# Patient Record
Sex: Male | Born: 1980
Health system: Southern US, Community
[De-identification: ages and names within clinical notes are randomized; demographics above are authoritative.]

## PROBLEM LIST (undated history)

## (undated) DIAGNOSIS — M109 Gout, unspecified: Secondary | ICD-10-CM

## (undated) DIAGNOSIS — J302 Other seasonal allergic rhinitis: Secondary | ICD-10-CM

## (undated) DIAGNOSIS — J45909 Unspecified asthma, uncomplicated: Secondary | ICD-10-CM

## (undated) HISTORY — PX: MOUTH SURGERY: SHX715

---

## 2015-01-30 ENCOUNTER — Encounter: Payer: Self-pay | Admitting: Emergency Medicine

## 2015-01-30 ENCOUNTER — Ambulatory Visit: Payer: BLUE CROSS/BLUE SHIELD

## 2015-01-30 ENCOUNTER — Ambulatory Visit
Admission: EM | Admit: 2015-01-30 | Discharge: 2015-01-30 | Disposition: A | Discharge status: Home / Self Care | Payer: BLUE CROSS/BLUE SHIELD | Attending: Family Medicine | Admitting: Family Medicine

## 2015-01-30 DIAGNOSIS — M79671 Pain in right foot: Secondary | ICD-10-CM | POA: Diagnosis present

## 2015-01-30 DIAGNOSIS — M10071 Idiopathic gout, right ankle and foot: Secondary | ICD-10-CM

## 2015-01-30 DIAGNOSIS — J45909 Unspecified asthma, uncomplicated: Secondary | ICD-10-CM | POA: Diagnosis not present

## 2015-01-30 DIAGNOSIS — M109 Gout, unspecified: Secondary | ICD-10-CM | POA: Diagnosis not present

## 2015-01-30 DIAGNOSIS — Z79899 Other long term (current) drug therapy: Secondary | ICD-10-CM | POA: Insufficient documentation

## 2015-01-30 HISTORY — DX: Unspecified asthma, uncomplicated: J45.909

## 2015-01-30 HISTORY — DX: Other seasonal allergic rhinitis: J30.2

## 2015-01-30 LAB — CBC WITH DIFFERENTIAL/PLATELET
Basophils Absolute: 0.1 10*3/uL (ref 0–0.1)
Basophils Relative: 1 %
EOS ABS: 0.3 10*3/uL (ref 0–0.7)
EOS PCT: 5 %
HEMATOCRIT: 44.3 % (ref 40.0–52.0)
Hemoglobin: 15 g/dL (ref 13.0–18.0)
LYMPHS ABS: 2 10*3/uL (ref 1.0–3.6)
LYMPHS PCT: 31 %
MCH: 30 pg (ref 26.0–34.0)
MCHC: 33.9 g/dL (ref 32.0–36.0)
MCV: 88.6 fL (ref 80.0–100.0)
MONO ABS: 0.4 10*3/uL (ref 0.2–1.0)
Monocytes Relative: 7 %
Neutro Abs: 3.6 10*3/uL (ref 1.4–6.5)
Neutrophils Relative %: 56 %
PLATELETS: 225 10*3/uL (ref 150–440)
RBC: 5 MIL/uL (ref 4.40–5.90)
RDW: 13.4 % (ref 11.5–14.5)
WBC: 6.5 10*3/uL (ref 3.8–10.6)

## 2015-01-30 LAB — BASIC METABOLIC PANEL
ANION GAP: 10 (ref 5–15)
BUN: 12 mg/dL (ref 6–20)
CHLORIDE: 102 mmol/L (ref 101–111)
CO2: 28 mmol/L (ref 22–32)
Calcium: 9.8 mg/dL (ref 8.9–10.3)
Creatinine, Ser: 1.24 mg/dL (ref 0.61–1.24)
GFR calc Af Amer: >60 mL/min (ref >60)
GFR calc non Af Amer: >60 mL/min (ref >60)
Glucose, Bld: 125 mg/dL — ABNORMAL HIGH (ref 65–99)
POTASSIUM: 4.4 mmol/L (ref 3.5–5.1)
Sodium: 140 mmol/L (ref 135–145)

## 2015-01-30 LAB — URIC ACID: URIC ACID, SERUM: 6.8 mg/dL (ref 4.4–7.6)

## 2015-01-30 MED ORDER — COLCHICINE 0.6 MG PO TABS: 1.2000 mg | ORAL_TABLET | Freq: Once | ORAL | Status: DC

## 2015-01-30 NOTE — Discharge Instructions (Signed)

## 2015-01-30 NOTE — ED Provider Notes (Signed)
CSN: [id number]     Arrival date & time 01/30/15  0707 History   First MD Initiated Contact with Patient 01/30/15 0747     Chief Complaint  Patient presents with  . Foot Pain    right   (Consider location/radiation/quality/duration/timing/severity/associated sxs/prior Treatment) HPI Comments: Caucasian male here for right foot swelling and great toe joint pain/swelling thinks it is gout as father developed gout around this age also.  First noticed pain 12 Jun after riding bike and playing with kids in the yard.  Progressively worsening but improved with ice pack/rest.  Has been woken up in the middle of the night when position change causes pain.  Started out just as soreness then swelling slight ly warm to touch; headache yesterday like his regular TMJ headache thinks it was triggered by foot/toe pain waking him up/not sleeping well the night before  Patient is a 34 y.o. male presenting with lower extremity pain. The history is provided by the patient.  Foot Pain This is a new problem. The current episode started more than 2 days ago. The problem occurs constantly. The problem has been gradually worsening. Associated symptoms include headaches. Pertinent negatives include no chest pain, no abdominal pain and no shortness of breath. The symptoms are aggravated by standing, exertion and walking. The symptoms are relieved by ice, relaxation and position. He has tried a cold compress, rest, food and water for the symptoms. The treatment provided mild relief.    Past Medical History  Diagnosis Date  . Asthma   . Seasonal allergies    History reviewed. No pertinent past surgical history. History reviewed. No pertinent family history. History  Substance Use Topics  . Smoking status: Never Smoker   . Smokeless tobacco: Never Used  . Alcohol Use: No    Review of Systems  Constitutional: Negative for fever, chills, diaphoresis, activity change, appetite change and fatigue.  HENT: Negative for  congestion, facial swelling and mouth sores.   Eyes: Negative for photophobia, pain, discharge, redness, itching and visual disturbance.  Respiratory: Negative for cough, choking, shortness of breath, wheezing and stridor.   Cardiovascular: Negative for chest pain, palpitations and leg swelling.  Gastrointestinal: Negative for nausea, vomiting, abdominal pain and diarrhea.  Endocrine: Negative for cold intolerance and heat intolerance.  Genitourinary: Negative for dysuria, flank pain and difficulty urinating.  Musculoskeletal: Positive for joint swelling and gait problem. Negative for myalgias, back pain, arthralgias, neck pain and neck stiffness.  Skin: Positive for color change. Negative for pallor, rash and wound.  Allergic/Immunologic: Positive for environmental allergies. Negative for food allergies.  Neurological: Positive for headaches. Negative for dizziness, tremors, seizures, syncope, facial asymmetry, speech difficulty, weakness, light-headedness and numbness.  Hematological: Negative for adenopathy. Does not bruise/bleed easily.  Psychiatric/Behavioral: Positive for sleep disturbance. Negative for behavioral problems, confusion and agitation.    Allergies  Nsaids  Home Medications   Prior to Admission medications   Medication Sig Start Date End Date Taking? Authorizing Provider  azelastine (OPTIVAR) 0.05 % ophthalmic solution Place 1 drop into both eyes 2 (two) times daily.   Yes Historical Provider, MD  fexofenadine (ALLEGRA) 180 MG tablet Take 180 mg by mouth daily.   Yes Historical Provider, MD  mometasone (NASONEX) 50 MCG/ACT nasal spray Place 2 sprays into the nose daily.   Yes Historical Provider, MD  montelukast (SINGULAIR) 10 MG tablet Take 10 mg by mouth at bedtime.   Yes Historical Provider, MD  colchicine 0.6 MG tablet Take 2 tablets (1.2 mg total)  by mouth once. May take 0.6mg  tab 1 hour after loading dose; may repeat dosing in 3 days if needed 01/30/15   Jarold Song  Betancourt, NP   BP 144/90 mmHg  Pulse 75  Temp(Src) 98 F (36.7 C) (Oral)  Resp 16  Ht  (1.778 m)  Wt 190 lb (86.183 kg)  BMI 27.26 kg/m2  SpO2 99% Physical Exam  Constitutional: He is oriented to person, place, and time. Vital signs are normal. He appears well-developed and well-nourished. No distress.  HENT:  Head: Normocephalic and atraumatic.  Right Ear: External ear normal.  Left Ear: External ear normal.  Nose: Nose normal.  Mouth/Throat: Oropharynx is clear and moist. No oropharyngeal exudate.  Eyes: Conjunctivae, EOM and lids are normal. Pupils are equal, round, and reactive to light. Right eye exhibits no discharge. Left eye exhibits no discharge. No scleral icterus.  Neck: Trachea normal and normal range of motion. Neck supple. No tracheal deviation present. No thyromegaly present.  Cardiovascular: Normal rate, regular rhythm and intact distal pulses.   Pulmonary/Chest: Effort normal. No stridor. No respiratory distress. He has no wheezes.  Abdominal: Soft. He exhibits no distension.  Musculoskeletal: Normal range of motion. He exhibits edema and tenderness.       Right ankle: Normal. He exhibits normal range of motion, no swelling, no ecchymosis, no deformity, no laceration and normal pulse. No tenderness. No lateral malleolus and no medial malleolus tenderness found. Achilles tendon normal. Achilles tendon exhibits no pain and no defect.       Right foot: There is tenderness and swelling. There is normal range of motion, no bony tenderness, normal capillary refill, no crepitus, no deformity and no laceration.       Feet:  Dorsum and plantar 1st MTP warm to touch and nonpitting edema 1+/4  Neurological: He is alert and oriented to person, place, and time. Coordination normal.  Skin: Skin is warm, dry and intact. Rash noted. No abrasion, no bruising, no burn, no ecchymosis, no laceration, no lesion, no petechiae and no purpura noted. Rash is macular. Rash is not  papular, not maculopapular, not nodular, not pustular, not vesicular and not urticarial. He is not diaphoretic. There is erythema. No cyanosis. No pallor. Nails show no clubbing.  Psychiatric: He has a normal mood and affect. His speech is normal and behavior is normal. Judgment and thought content normal. Cognition and memory are normal.  Nursing note and vitals reviewed.   ED Course  Procedures (including critical care time) Labs Review Labs Reviewed  BASIC METABOLIC PANEL - Abnormal; Notable for the following:    Glucose, Bld 125 (*)    All other components within normal limits  CBC WITH DIFFERENTIAL/PLATELET  URIC ACID    Imaging Review Dg Foot Complete Right  01/30/2015   CLINICAL DATA:  Right foot pain and swelling for 1 week, no known injury  EXAM: RIGHT FOOT COMPLETE - 3+ VIEW  COMPARISON:  None.  FINDINGS: Joint spaces appear relatively normal. There is some soft tissue prominence adjacent to the right first MTP joint. No erosion is seen, but early changes of gout cannot be excluded. No soft tissue calcification is noted. Tarsal metatarsal alignment is normal. There is a small plantar calcaneal degenerative spur present.  IMPRESSION: 1. No acute bony abnormality. Soft tissue swelling adjacent of the right first MTP joint. Cannot exclude early gout. 2. Small plantar calcaneal degenerative spur.   Electronically Signed   By: Dwyane Dee M.D.   On: 01/30/2015 08:20  Patient given copy of radiology and lab reports discussed them with patient.  Given [facility] handout on foods to avoid and foods to consider as studies show lowering of uric acid levels to patient along with exitcare handout on gout.  Work note for today along with 7 days of restrictions avoid heavy lifting/strenuous activity.  Reiterated avoid dehydration.  Patient verbalized understanding of information/instructions,agreed with plan of care and had no further questions at this time.  MDM   1. Acute gout of right foot,  unspecified cause    Will get x-ray and uric acid levels. Discussed pathophysiology of gout with patient.   Exitcare handout on gout given to patient and discussed foods to avoid:  beer, hard liquor, meats, seafood, high corn fructose beverages/food items.  Will get baseline uric acid level, cbc to check for infection and image joint to check for arthritis also.  Discussed could be pseudogout, gout, infection with patient.  Trial of colchicine as NSAID allergy.  Diet modification.  Discussed aspiration of joint fluid may be indicated if worsening symptoms along with antibiotics (pending labs at this time)   [facility] up with [facility] for annual preventive visit in 6 months; sooner if problems or worsening of symptoms despite following above plan of care.  Patient verbalized understanding of instructions, agreed with plan of care and had no further questions at this time. P2:  low purine diet    Barbaraann Barthel, NP 01/30/15 2774  Barbaraann Barthel, NP 01/30/15 [phone]

## 2015-01-30 NOTE — ED Notes (Signed)
Patient c/o right foot pain and swelling and in his right 1st toe for 3 days.  Patient denies injury.

## 2015-09-24 ENCOUNTER — Ambulatory Visit
Admission: EM | Admit: 2015-09-24 | Discharge: 2015-09-24 | Disposition: A | Discharge status: Home / Self Care | Payer: BLUE CROSS/BLUE SHIELD | Attending: Family Medicine | Admitting: Family Medicine

## 2015-09-24 ENCOUNTER — Encounter: Payer: Self-pay | Admitting: *Deleted

## 2015-09-24 DIAGNOSIS — R109 Unspecified abdominal pain: Secondary | ICD-10-CM | POA: Diagnosis not present

## 2015-09-24 HISTORY — DX: Gout, unspecified: M10.9

## 2015-09-24 LAB — URINALYSIS COMPLETE WITH MICROSCOPIC (ARMC ONLY)
BACTERIA UA: NONE SEEN
Glucose, UA: NEGATIVE mg/dL
Leukocytes, UA: NEGATIVE
NITRITE: NEGATIVE
Protein, ur: NEGATIVE mg/dL
SQUAMOUS EPITHELIAL / LPF: NONE SEEN
Specific Gravity, Urine: 1.025 (ref 1.005–1.030)
pH: 6 (ref 5.0–8.0)

## 2015-09-24 MED ORDER — TAMSULOSIN HCL 0.4 MG PO CAPS: 0.4000 mg | ORAL_CAPSULE | Freq: Every day | ORAL | Status: DC

## 2015-09-24 NOTE — ED Notes (Signed)
Patient started having severe vomiting (with no diarrhea) this past Sunday evening which has resolved, but he continues to have severe left flank pain. Patient has taken one norco with no resolution.

## 2015-09-24 NOTE — Discharge Instructions (Signed)
Flank Pain °Flank pain refers to pain that is located on the side of the body between the upper abdomen and the back. The pain may occur over a short period of time (acute) or may be long-term or reoccurring (chronic). It may be mild or severe. Flank pain can be caused by many things. °CAUSES  °Some of the more common causes of flank pain include: °· Muscle strains.   °· Muscle spasms.   °· A disease of your spine (vertebral disk disease).   °· A lung infection (pneumonia).   °· Fluid around your lungs (pulmonary edema).   °· A kidney infection.   °· Kidney stones.   °· A very painful skin rash caused by the chickenpox virus (shingles).   °· Gallbladder disease.   °HOME CARE INSTRUCTIONS  °Home care will depend on the cause of your pain. In general, °· Rest as directed by your caregiver. °· Drink enough fluids to keep your urine clear or pale yellow. °· Only take over-the-counter or prescription medicines as directed by your caregiver. Some medicines may help relieve the pain. °· Tell your caregiver about any changes in your pain. °· Follow up with your caregiver as directed. °SEEK IMMEDIATE MEDICAL CARE IF:  °· Your pain is not controlled with medicine.   °· You have new or worsening symptoms. °· Your pain increases.   °· You have abdominal pain.   °· You have shortness of breath.   °· You have persistent nausea or vomiting.   °· You have swelling in your abdomen.   °· You feel faint or pass out.   °· You have blood in your urine. °· You have a fever or persistent symptoms for more than 2-3 days. °· You have a fever and your symptoms suddenly get worse. °MAKE SURE YOU:  °· Understand these instructions. °· Will watch your condition. °· Will get help right away if you are not doing well or get worse. °  °This information is not intended to replace advice given to you by your health care provider. Make sure you discuss any questions you have with your health care provider. °  °Document Released: 09/24/2005 Document  Revised: 04/27/2012 Document Reviewed: 03/17/2012 °Elsevier Interactive Patient Education ©2016 [employer] ° °

## 2015-09-24 NOTE — ED Provider Notes (Signed)
CSN: [id number]     Arrival date & time 09/24/15  1849 History   First MD Initiated Contact with Patient 09/24/15 2131     Chief Complaint  Patient presents with  . Flank Pain   (Consider location/radiation/quality/duration/timing/severity/associated sxs/prior Treatment) HPI  35 yo M reports sudden onset left flank pain while seated watching SuperBowl 2 days ago No physical activity, no trauma- intense sudden onset reported,apparently spontaneous- During the night developed abdominal discomfort, nausea and vomiting, no diarrhea. Recalled that a number of men at work had  Viral gastritis during the week before and suspected the same. Wife is pharmacist and had Zofran 8 mg available-which helped. Never developed diarrhea. Pain localized to mid left back /flank. He is reported to have true allergy to NSAIDs (respiratory distress) and utilized  Medication available- One Norco for pain yesterday, today again at 6:30 pm. Pain continues- now has some left lower quadrant radiation, with left scrotal ache. Has been drinking G2 and water- voiding regularly - has not knowingly passed anything No previous history of kidney stones, kidney colic or GI disease No fever noted Has hx Right great toe gout and has standing  colchicine order -reports about 3 episodes in interim year, none recently Hx asthma Past Medical History  Diagnosis Date  . Asthma   . Seasonal allergies    History reviewed. No pertinent past surgical history. History reviewed. No pertinent family history. Social History  Substance Use Topics  . Smoking status: Never Smoker   . Smokeless tobacco: Never Used  . Alcohol Use: No    Review of Systems Constitutional: No fever. No headache. Eyes: No visual changes. ENT:No sore throat. Cardiovascular:Negative for chest pain/palpitations Respiratory: Negative for shortness of breath Gastrointestinal: No abdominal pain after first day, Mild nausea, No vomiting, No  diarrhea Genitourinary: Negative for dysuria. Normal urination. Left flank pain persists, mildly peristaltic Musculoskeletal: Negative for low back pain- directed to mid left flank. FROM extremities without pain Skin: Negative for rash Neurological: Negative for headache, focal weakness or numbness  Allergies  Gluten meal and Nsaids  Home Medications   Prior to Admission medications   Medication Sig Start Date End Date Taking? Authorizing Provider  azelastine (OPTIVAR) 0.05 % ophthalmic solution Place 1 drop into both eyes 2 (two) times daily.   Yes Historical Provider, MD  colchicine 0.6 MG tablet Take 2 tablets (1.2 mg total) by mouth once. May take 0.6mg  tab 1 hour after loading dose; may repeat dosing in 3 days if needed 01/30/15  Yes Barbaraann Barthel, NP  fexofenadine (ALLEGRA) 180 MG tablet Take 180 mg by mouth daily.   Yes Historical Provider, MD  mometasone (NASONEX) 50 MCG/ACT nasal spray Place 2 sprays into the nose daily.   Yes Historical Provider, MD  montelukast (SINGULAIR) 10 MG tablet Take 10 mg by mouth at bedtime.   Yes Historical Provider, MD  tamsulosin (FLOMAX) 0.4 MG CAPS capsule Take 1 capsule (0.4 mg total) by mouth daily. 09/24/15   Rae Halsted, PA-C   Meds Ordered and Administered this Visit  Medications - No data to display  BP 146/98 mmHg  Pulse 85  Temp(Src) 99.5 F (37.5 C) (Tympanic)  Resp 16  Ht  (1.778 m)  Wt 192 lb (87.091 kg)  BMI 27.55 kg/m2  SpO2 97% No data found.   Physical Exam   Constitutional -alert and oriented,uncomfortable , holding left flank General: Mild/Mod  distress Head-atraumatic, normocephalic Eyes- conjunctiva normal, EOMI ,conjugate gaze Ears: grossly normal hearing  Nose- no congestion or rhinorrhea Mouth/throat- mucous membranes moist ,oropharynx non-erythematous Neck- supple without glandular enlargement CV- regular rate and rhythmn Resp-no distress, normal respiratory effort,clear to auscultation  bilaterally Back- localized discomfort left mid flank , no skin changes, no heat, no mass- even light percussion yields pain GI- soft,non-tender,no distention, normal bowel sounds GU- not examined MSK- non tender, normal ROM, ambulatory, no gait instability,on /off table solo Neuro- normal speech and language, no gross focal neurological deficit appreciated,  Skin-warm,dry ,intact;  Psych-mood and affect grossly normal; speech and behavior grossly normal ED Course  Procedures (including critical care time)  Labs Review Labs Reviewed  URINALYSIS COMPLETEWITH MICROSCOPIC ([facility] ONLY) - Abnormal; Notable for the following:    Bilirubin Urine 1+ (*)    Ketones, ur 1+ (*)    Hgb urine dipstick TRACE (*)    All other components within normal limits  URINE CULTURE    Imaging Review No results found.  Physical findings are locally specific and pain has not been controlled with PO narcotics in a young man who  Reports true allergy to NSAIDs. He reports increased pain but rates it a 4 at discharge We do not have CT available during the night. Patient  and wife are counseled that we recommend transfer to ER of choice- 24 hour diagnostics available and IV support with pain medications available as needed. He is discharged with a strainer and urine cup- discussed possible progression if stone /gravel present-strain all urine Urine will be submitted for culture and 3 days call back requested Flomax 0.4 mg called to pharmacy VS at discharge 146/98, Temp 99.5- increase hydration, use additional Zofran if nausea develops    MDM   1. Left flank pain    Diagnosis and treatment discussed with patient and wife-presentation c/w possible kidney stone  Questions fielded, expectations and recommendations for more complete evaluation reviewed. [facility], [facility] and [facility] ERs discussed They entertain the possibility of observing overnight, hydrating and reporting for evaluation in AM. Discussed follow up and  return parameters including no resolution or any worsening condition..  Patient expresses understanding  and agrees to plan.  Will return to [facility] /ER of choice with questions, concerns or exacerbation.  3 day call for urine culture results requested  Rae Halsted, PA-C 09/26/15 2326

## 2015-09-25 ENCOUNTER — Encounter: Payer: Self-pay | Admitting: Obstetrics and Gynecology

## 2015-09-25 ENCOUNTER — Ambulatory Visit (INDEPENDENT_AMBULATORY_CARE_PROVIDER_SITE_OTHER): Payer: BLUE CROSS/BLUE SHIELD | Admitting: Obstetrics and Gynecology

## 2015-09-25 VITALS — BP 140/95 | HR 109 | Resp 16 | Ht 70.0 in | Wt 195.2 lb

## 2015-09-25 DIAGNOSIS — R3129 Other microscopic hematuria: Secondary | ICD-10-CM

## 2015-09-25 DIAGNOSIS — R109 Unspecified abdominal pain: Secondary | ICD-10-CM | POA: Diagnosis not present

## 2015-09-25 MED ORDER — HYDROCODONE-ACETAMINOPHEN 5-300 MG PO TABS: ORAL_TABLET | ORAL | Status: DC

## 2015-09-25 NOTE — Progress Notes (Signed)
09/25/2015 3:29 PM   Dean Ellis 14-Jul-1981 [id number]  Referring provider: No referring provider defined for this encounter.  Chief Complaint  Patient presents with  . Flank Pain  . Establish Care    HPI: Patient is a 35 year old male presenting today after being seen in urgent care last night for acute onset of left flank pain. He reports that he has been experiencing nausea and vomiting for the last 4 days which he has been controlling with by mouth Zofran that he hasn't had available at home. He began experiencing severe left flank pain yesterday. They were unable to perform a CT or other imaging studies at the urgent care. He was discharged with Flomax, Vicodin and a urine strainer.  His pain is really and reasonably well controlled with Vicodin. He has been straining his urine, pushing fluids and taking Zofran as needed. He presents today with continued pain. He denies any urinary symptoms including frequency, urgency, gross hematuria or dysuria. He has not experienced any fevers. Although there is exacerbating or alleviating factors. No other exacerbating or alleviating factors.  No previous hx of stones.  0-5 on UA last night.    PMH: Past Medical History  Diagnosis Date  . Asthma   . Seasonal allergies   . Gout     Surgical History: Past Surgical History  Procedure Laterality Date  . Mouth surgery      Home Medications:    Medication List       This list is accurate as of: 09/25/15  3:29 PM.  Always use your most recent med list.               azelastine 0.05 % ophthalmic solution  Commonly known as:  OPTIVAR  Place 1 drop into both eyes 2 (two) times daily.     colchicine 0.6 MG tablet  Take 2 tablets (1.2 mg total) by mouth once. May take 0.6mg  tab 1 hour after loading dose; may repeat dosing in 3 days if needed     fexofenadine 180 MG tablet  Commonly known as:  ALLEGRA  Take 180 mg by mouth daily.     Hydrocodone-Acetaminophen 5-300 MG Tabs   Commonly known as:  VICODIN  1 tablet every 6 hours for sever pain     mometasone 50 MCG/ACT nasal spray  Commonly known as:  NASONEX  Place 2 sprays into the nose daily.     montelukast 10 MG tablet  Commonly known as:  SINGULAIR  Take 10 mg by mouth at bedtime.     tamsulosin 0.4 MG Caps capsule  Commonly known as:  FLOMAX  Take 1 capsule (0.4 mg total) by mouth daily.        Allergies:  Allergies  Allergen Reactions  . Gluten Meal Anaphylaxis  . Nsaids Anaphylaxis  . Aspirin Swelling    Family History: Family History  Problem Relation Age of Onset  . Hypertension Father     Social History:  reports that he has never smoked. He has never used smokeless tobacco. He reports that he does not drink alcohol or use illicit drugs.  ROS: UROLOGY Frequent Urination?: Yes Hard to postpone urination?: No Burning/pain with urination?: No Get up at night to urinate?: Yes Leakage of urine?: No Urine stream starts and stops?: No Trouble starting stream?: No Do you have to strain to urinate?: No Blood in urine?: Yes Urinary tract infection?: No Sexually transmitted disease?: No Injury to kidneys or bladder?: No Painful intercourse?: No Weak stream?: No  Erection problems?: No Penile pain?: No  Gastrointestinal Nausea?: Yes Vomiting?: Yes Indigestion/heartburn?: No Diarrhea?: No Constipation?: Yes  Constitutional Fever: No Night sweats?: Yes Weight loss?: No Fatigue?: No  Skin Skin rash/lesions?: No Itching?: No  Eyes Blurred vision?: No Double vision?: No  Ears/Nose/Throat Sore throat?: No Sinus problems?: No  Hematologic/Lymphatic Swollen glands?: No Easy bruising?: No  Cardiovascular Leg swelling?: No Chest pain?: No  Respiratory Cough?: No Shortness of breath?: No  Endocrine Excessive thirst?: No  Musculoskeletal Back pain?: Yes Joint pain?: No  Neurological Headaches?: Yes Dizziness?: Yes  Psychologic Depression?:  No Anxiety?: No  Physical Exam: BP 140/95 mmHg  Pulse 109  Resp 16  Ht  (1.778 m)  Wt 195 lb 3.2 oz (88.542 kg)  BMI 28.01 kg/m2  Constitutional:  Alert and oriented, No acute distress. HEENT: [place] AT, moist mucus membranes.  Trachea midline, no masses. Cardiovascular: No clubbing, cyanosis, or edema. Respiratory: Normal respiratory effort, no increased work of breathing. GI: Abdomen is soft, left upper quadrant tenderness, nondistended, no abdominal masses GU: Left CVA tenderness. Skin: No rashes, bruises or suspicious lesions. Lymph: No cervical or inguinal adenopathy. Neurologic: Grossly intact, no focal deficits, moving all 4 extremities. Psychiatric: Normal mood and affect.  Laboratory Data:  Lab Results  Component Value Date   WBC 6.5 01/30/2015   HGB 15.0 01/30/2015   HCT 44.3 01/30/2015   MCV 88.6 01/30/2015   PLT 225 01/30/2015    Lab Results  Component Value Date   CREATININE 1.24 01/30/2015    No results found for: PSA  No results found for: TESTOSTERONE  No results found for: HGBA1C  Urinalysis   Pertinent Imaging:   Assessment & Plan:    1. Flank pain- Left x 2 days. Patient provided prescription for Vicodin. He was instructed to continue his Flomax and take Zofran as needed for nausea. He will also push fluids and strain his urine. He was advised to seek immediate medical attention at the emergency department should he develop fevers, uncontrolled pain or vomiting. - Urinalysis, Complete  2. Microscopic hematuria-  11-30 RBCs on UA today. We discussed the differential diagnosis for microscopic hematuria including nephrolithiasis, renal or upper tract tumors, bladder stones, UTIs, or bladder tumors as well as undetermined etiologies. Per AUA guidelines, I did recommend complete microscopic hematuria evaluation including CTU, possible urine cytology, and office cystoscopy. -CT Urogram- return for results and cystoscopy scheduling pending CT  findings   Return for CT results.  These notes generated with voice recognition software. I apologize for typographical errors.  Earlie Lou, FNP  [facility] [address] [place], [place] [address] [phone]

## 2015-09-25 NOTE — Patient Instructions (Signed)
Dietary Guidelines to Help Prevent Kidney Stones °Your risk of kidney stones can be decreased by adjusting the foods you eat. The most important thing you can do is drink enough fluid. You should drink enough fluid to keep your urine clear or pale yellow. The following guidelines provide specific information for the type of kidney stone you have had. °GUIDELINES ACCORDING TO TYPE OF KIDNEY STONE °Calcium Oxalate Kidney Stones °· Reduce the amount of salt you eat. Foods that have a lot of salt cause your body to release excess calcium into your urine. The excess calcium can combine with a substance called oxalate to form kidney stones. °· Reduce the amount of animal protein you eat if the amount you eat is excessive. Animal protein causes your body to release excess calcium into your urine. Ask your dietitian how much protein from animal sources you should be eating. °· Avoid foods that are high in oxalates. If you take vitamins, they should have less than 500 mg of vitamin C. Your body turns vitamin C into oxalates. You do not need to avoid fruits and vegetables high in vitamin C. °Calcium Phosphate Kidney Stones °· Reduce the amount of salt you eat to help prevent the release of excess calcium into your urine. °· Reduce the amount of animal protein you eat if the amount you eat is excessive. Animal protein causes your body to release excess calcium into your urine. Ask your dietitian how much protein from animal sources you should be eating. °· Get enough calcium from food or take a calcium supplement (ask your dietitian for recommendations). Food sources of calcium that do not increase your risk of kidney stones include: °¨ Broccoli. °¨ Dairy products, such as cheese and yogurt. °¨ Pudding. °Uric Acid Kidney Stones °· Do not have more than 6 oz of animal protein per day. °FOOD SOURCES °Animal Protein Sources °· Meat (all types). °· Poultry. °· Eggs. °· Fish, seafood. °Foods High in Salt °· Salt seasonings. °· Soy  sauce. °· Teriyaki sauce. °· Cured and processed meats. °· Salted crackers and snack foods. °· Fast food. °· Canned soups and most canned foods. °Foods High in Oxalates °· Grains: °¨ Amaranth. °¨ Barley. °¨ Grits. °¨ Wheat germ. °¨ Bran. °¨ Buckwheat flour. °¨ All bran cereals. °¨ Pretzels. °¨ Whole wheat bread. °· Vegetables: °¨ Beans (wax). °¨ Beets and beet greens. °¨ Collard greens. °¨ Eggplant. °¨ Escarole. °¨ Leeks. °¨ Okra. °¨ Parsley. °¨ Rutabagas. °¨ Spinach. °¨ Swiss chard. °¨ Tomato paste. °¨ Fried potatoes. °¨ Sweet potatoes. °· Fruits: °¨ Red currants. °¨ Figs. °¨ Kiwi. °¨ Rhubarb. °· Meat and Other Protein Sources: °¨ Beans (dried). °¨ Soy burgers and other soybean products. °¨ Miso. °¨ Nuts (peanuts, almonds, pecans, cashews, hazelnuts). °¨ Nut butters. °¨ Sesame seeds and tahini (paste made of sesame seeds). °¨ Poppy seeds. °· Beverages: °¨ Chocolate drink mixes. °¨ Soy milk. °¨ Instant iced tea. °¨ Juices made from high-oxalate fruits or vegetables. °· Other: °¨ Carob. °¨ Chocolate. °¨ Fruitcake. °¨ Marmalades. °  °This information is not intended to replace advice given to you by your health care provider. Make sure you discuss any questions you have with your health care provider. °  °Document Released: 11/28/2010 Document Revised: 08/08/2013 Document Reviewed: 06/30/2013 °Elsevier Interactive Patient Education ©2016 Elsevier Inc[employer] ° ° °Kidney Stones °Kidney stones (urolithiasis) are deposits that form inside your kidneys. The intense pain is caused by the stone moving through the urinary tract. When the stone moves, the   ureter goes into spasm around the stone. The stone is usually passed in the urine.  °CAUSES  °· A disorder that makes certain neck glands produce too much parathyroid hormone (primary hyperparathyroidism). °· A buildup of uric acid crystals, similar to gout in your joints. °· Narrowing (stricture) of the ureter. °· A kidney obstruction present at birth (congenital  obstruction). °· Previous surgery on the kidney or ureters. °· Numerous kidney infections. °SYMPTOMS  °· Feeling sick to your stomach (nauseous). °· Throwing up (vomiting). °· Blood in the urine (hematuria). °· Pain that usually spreads (radiates) to the groin. °· Frequency or urgency of urination. °DIAGNOSIS  °· Taking a history and physical exam. °· Blood or urine tests. °· CT scan. °· Occasionally, an examination of the inside of the urinary bladder (cystoscopy) is performed. °TREATMENT  °· Observation. °· Increasing your fluid intake. °· Extracorporeal shock wave lithotripsy--This is a noninvasive procedure that uses shock waves to break up kidney stones. °· Surgery may be needed if you have severe pain or persistent obstruction. There are various surgical procedures. Most of the procedures are performed with the use of small instruments. Only small incisions are needed to accommodate these instruments, so recovery time is minimized. °The size, location, and chemical composition are all important variables that will determine the proper choice of action for you. Talk to your health care provider to better understand your situation so that you will minimize the risk of injury to yourself and your kidney.  °HOME CARE INSTRUCTIONS  °· Drink enough water and fluids to keep your urine clear or pale yellow. This will help you to pass the stone or stone fragments. °· Strain all urine through the provided strainer. Keep all particulate matter and stones for your health care provider to see. The stone causing the pain may be as small as a grain of salt. It is very important to use the strainer each and every time you pass your urine. The collection of your stone will allow your health care provider to analyze it and verify that a stone has actually passed. The stone analysis will often identify what you can do to reduce the incidence of recurrences. °· Only take over-the-counter or prescription medicines for pain,  discomfort, or fever as directed by your health care provider. °· Keep all follow-up visits as told by your health care provider. This is important. °· Get follow-up X-rays if required. The absence of pain does not always mean that the stone has passed. It may have only stopped moving. If the urine remains completely obstructed, it can cause loss of kidney function or even complete destruction of the kidney. It is your responsibility to make sure X-rays and follow-ups are completed. Ultrasounds of the kidney can show blockages and the status of the kidney. Ultrasounds are not associated with any radiation and can be performed easily in a matter of minutes. °· Make changes to your daily diet as told by your health care provider. You may be told to: °¨ Limit the amount of salt that you eat. °¨ Eat 5 or more servings of fruits and vegetables each day. °¨ Limit the amount of meat, poultry, fish, and eggs that you eat. °· Collect a 24-hour urine sample as told by your health care provider. You may need to collect another urine sample every 6-12 months. °SEEK MEDICAL CARE IF: °· You experience pain that is progressive and unresponsive to any pain medicine you have been prescribed. °SEEK IMMEDIATE MEDICAL CARE IF:  °·   Pain cannot be controlled with the prescribed medicine. °· You have a fever or shaking chills. °· The severity or intensity of pain increases over 18 hours and is not relieved by pain medicine. °· You develop a new onset of abdominal pain. °· You feel faint or pass out. °· You are unable to urinate. °  °This information is not intended to replace advice given to you by your health care provider. Make sure you discuss any questions you have with your health care provider. °  °Document Released: 08/03/2005 Document Revised: 04/24/2015 Document Reviewed: 01/04/2013 °Elsevier Interactive Patient Education ©2016 Elsevier Inc. ° °

## 2015-09-26 LAB — URINALYSIS, COMPLETE
Bilirubin, UA: NEGATIVE
Glucose, UA: NEGATIVE
LEUKOCYTES UA: NEGATIVE
Nitrite, UA: NEGATIVE
PH UA: 6 (ref 5.0–7.5)
Protein, UA: NEGATIVE
Specific Gravity, UA: 1.015 (ref 1.005–1.030)
UUROB: 0.2 mg/dL (ref 0.2–1.0)

## 2015-09-26 LAB — MICROSCOPIC EXAMINATION
EPITHELIAL CELLS (NON RENAL): NONE SEEN /HPF (ref 0–10)
Renal Epithel, UA: NONE SEEN /hpf

## 2015-09-26 LAB — URINE CULTURE: SPECIAL REQUESTS: NORMAL

## 2015-09-27 ENCOUNTER — Ambulatory Visit
Admission: RE | Admit: 2015-09-27 | Discharge: 2015-09-27 | Disposition: A | Discharge status: Home / Self Care | Payer: BLUE CROSS/BLUE SHIELD | Source: Ambulatory Visit | Attending: Obstetrics and Gynecology | Admitting: Obstetrics and Gynecology

## 2015-09-27 ENCOUNTER — Telehealth: Payer: Self-pay

## 2015-09-27 DIAGNOSIS — N132 Hydronephrosis with renal and ureteral calculous obstruction: Secondary | ICD-10-CM | POA: Diagnosis not present

## 2015-09-27 DIAGNOSIS — R112 Nausea with vomiting, unspecified: Secondary | ICD-10-CM | POA: Diagnosis not present

## 2015-09-27 DIAGNOSIS — R3129 Other microscopic hematuria: Secondary | ICD-10-CM

## 2015-09-27 DIAGNOSIS — R10A0 Flank pain, unspecified side: Secondary | ICD-10-CM

## 2015-09-27 DIAGNOSIS — R109 Unspecified abdominal pain: Secondary | ICD-10-CM

## 2015-09-27 DIAGNOSIS — N201 Calculus of ureter: Secondary | ICD-10-CM | POA: Insufficient documentation

## 2015-09-27 MED ORDER — IOHEXOL 350 MG/ML SOLN
150.0000 mL | Freq: Once | INTRAVENOUS | Status: AC | PRN
  Administered 2015-09-27: 150 mL via INTRAVENOUS

## 2015-09-27 MED ORDER — HYDROCODONE-ACETAMINOPHEN 5-300 MG PO TABS: ORAL_TABLET | ORAL | Status: DC

## 2015-09-27 NOTE — Telephone Encounter (Signed)
If he is taking the medication as directed he should still have some medication left. I will give him 15 more tablets for him to take over the weekend. He will need to come to the office to pick up the prescription.

## 2015-09-27 NOTE — Telephone Encounter (Signed)
LMOM

## 2015-09-27 NOTE — Telephone Encounter (Signed)
Spoke to pt in reference to pain medication. Made aware he can have 15 more tabs. And would have to pick them up in office. Pt voiced understanding.

## 2015-09-27 NOTE — Telephone Encounter (Signed)
Pt called stating he has scheduled his CT for this afternoon and he has his f/u appt on Monday. Pt was requesting more pain medication. Please advise.

## 2015-09-30 ENCOUNTER — Encounter: Payer: Self-pay | Admitting: Obstetrics and Gynecology

## 2015-09-30 ENCOUNTER — Ambulatory Visit (INDEPENDENT_AMBULATORY_CARE_PROVIDER_SITE_OTHER): Payer: BLUE CROSS/BLUE SHIELD | Admitting: Obstetrics and Gynecology

## 2015-09-30 VITALS — BP 147/85 | HR 69 | Resp 16 | Ht 70.0 in | Wt 193.1 lb

## 2015-09-30 DIAGNOSIS — R109 Unspecified abdominal pain: Secondary | ICD-10-CM | POA: Diagnosis not present

## 2015-09-30 DIAGNOSIS — N200 Calculus of kidney: Secondary | ICD-10-CM

## 2015-09-30 NOTE — Patient Instructions (Signed)
Kidney Stones Kidney stones (urolithiasis) are deposits that form inside your kidneys. The intense pain is caused by the stone moving through the urinary tract. When the stone moves, the ureter goes into spasm around the stone. The stone is usually passed in the urine.  CAUSES   A disorder that makes certain neck glands produce too much parathyroid hormone (primary hyperparathyroidism).  A buildup of uric acid crystals, similar to gout in your joints.  Narrowing (stricture) of the ureter.  A kidney obstruction present at birth (congenital obstruction).  Previous surgery on the kidney or ureters.  Numerous kidney infections. SYMPTOMS   Feeling sick to your stomach (nauseous).  Throwing up (vomiting).  Blood in the urine (hematuria).  Pain that usually spreads (radiates) to the groin.  Frequency or urgency of urination. DIAGNOSIS   Taking a history and physical exam.  Blood or urine tests.  CT scan.  Occasionally, an examination of the inside of the urinary bladder (cystoscopy) is performed. TREATMENT   Observation.  Increasing your fluid intake.  Extracorporeal shock wave lithotripsy--This is a noninvasive procedure that uses shock waves to break up kidney stones.  Surgery may be needed if you have severe pain or persistent obstruction. There are various surgical procedures. Most of the procedures are performed with the use of small instruments. Only small incisions are needed to accommodate these instruments, so recovery time is minimized. The size, location, and chemical composition are all important variables that will determine the proper choice of action for you. Talk to your health care provider to better understand your situation so that you will minimize the risk of injury to yourself and your kidney.  HOME CARE INSTRUCTIONS   Drink enough water and fluids to keep your urine clear or pale yellow. This will help you to pass the stone or stone fragments.  Strain  all urine through the provided strainer. Keep all particulate matter and stones for your health care provider to see. The stone causing the pain may be as small as a grain of salt. It is very important to use the strainer each and every time you pass your urine. The collection of your stone will allow your health care provider to analyze it and verify that a stone has actually passed. The stone analysis will often identify what you can do to reduce the incidence of recurrences.  Only take over-the-counter or prescription medicines for pain, discomfort, or fever as directed by your health care provider.  Keep all follow-up visits as told by your health care provider. This is important.  Get follow-up X-rays if required. The absence of pain does not always mean that the stone has passed. It may have only stopped moving. If the urine remains completely obstructed, it can cause loss of kidney function or even complete destruction of the kidney. It is your responsibility to make sure X-rays and follow-ups are completed. Ultrasounds of the kidney can show blockages and the status of the kidney. Ultrasounds are not associated with any radiation and can be performed easily in a matter of minutes.  Make changes to your daily diet as told by your health care provider. You may be told to:  Limit the amount of salt that you eat.  Eat 5 or more servings of fruits and vegetables each day.  Limit the amount of meat, poultry, fish, and eggs that you eat.  Collect a 24-hour urine sample as told by your health care provider.You may need to collect another urine sample every 6-12   months. SEEK MEDICAL CARE IF:  You experience pain that is progressive and unresponsive to any pain medicine you have been prescribed. SEEK IMMEDIATE MEDICAL CARE IF:   Pain cannot be controlled with the prescribed medicine.  You have a fever or shaking chills.  The severity or intensity of pain increases over 18 hours and is not  relieved by pain medicine.  You develop a new onset of abdominal pain.  You feel faint or pass out.  You are unable to urinate.   This information is not intended to replace advice given to you by your health care provider. Make sure you discuss any questions you have with your health care provider.   Document Released: 08/03/2005 Document Revised: 04/24/2015 Document Reviewed: 01/04/2013 Elsevier Interactive Patient Education 2016 Else[employer] Inc[employer] Dietary Guidelines to Help Prevent Kidney Stones Your risk of kidney stones can be decreased by adjusting the foods you eat. The most important thing you can do is drink enough fluid. You should drink enough fluid to keep your urine clear or pale yellow. The following guidelines provide specific information for the type of kidney stone you have had. GUIDELINES ACCORDING TO TYPE OF KIDNEY STONE Calcium Oxalate Kidney Stones  Reduce the amount of salt you eat. Foods that have a lot of salt cause your body to release excess calcium into your urine. The excess calcium can combine with a substance called oxalate to form kidney stones.  Reduce the amount of animal protein you eat if the amount you eat is excessive. Animal protein causes your body to release excess calcium into your urine. Ask your dietitian how much protein from animal sources you should be eating.  Avoid foods that are high in oxalates. If you take vitamins, they should have less than 500 mg of vitamin C. Your body turns vitamin C into oxalates. You do not need to avoid fruits and vegetables high in vitamin C. Calcium Phosphate Kidney Stones  Reduce the amount of salt you eat to help prevent the release of excess calcium into your urine.  Reduce the amount of animal protein you eat if the amount you eat is excessive. Animal protein causes your body to release excess calcium into your urine. Ask your dietitian how much protein from animal sources you should be eating.  Get enough  calcium from food or take a calcium supplement (ask your dietitian for recommendations). Food sources of calcium that do not increase your risk of kidney stones include:  Broccoli.  Dairy products, such as cheese and yogurt.  Pudding. Uric Acid Kidney Stones  Do not have more than 6 oz of animal protein per day. FOOD SOURCES Animal Protein Sources  Meat (all types).  Poultry.  Eggs.  Fish, seafood. Foods High in Salt  Salt seasonings.  Soy sauce.  Teriyaki sauce.  Cured and processed meats.  Salted crackers and snack foods.  Fast food.  Canned soups and most canned foods. Foods High in Oxalates  Grains:  Amaranth.  Barley.  Grits.  Wheat germ.  Bran.  Buckwheat flour.  All bran cereals.  Pretzels.  Whole wheat bread.  Vegetables:  Beans (wax).  Beets and beet greens.  Collard greens.  Eggplant.  Escarole.  Leeks.  Okra.  Parsley.  Rutabagas.  Spinach.  Swiss chard.  Tomato paste.  Fried potatoes.  Sweet potatoes.  Fruits:  Red currants.  Figs.  Kiwi.  Rhubarb.  Meat and Other Protein Sources:  Beans (dried).  Soy burgers and other soybean products.  Miso.    Nuts (peanuts, almonds, pecans, cashews, hazelnuts).  Nut butters.  Sesame seeds and tahini (paste made of sesame seeds).  Poppy seeds.  Beverages:  Chocolate drink mixes.  Soy milk.  Instant iced tea.  Juices made from high-oxalate fruits or vegetables.  Other:  Carob.  Chocolate.  Fruitcake.  Marmalades.   This information is not intended to replace advice given to you by your health care provider. Make sure you discuss any questions you have with your health care provider.   Document Released: 11/28/2010 Document Revised: 08/08/2013 Document Reviewed: 06/30/2013 Elsevier Interactive Patient Education 2016 Elsevier Inc.  

## 2015-09-30 NOTE — Progress Notes (Signed)
4:53 PM   Dean Ellis 1981/04/02 [id number]  Referring provider: Tamsen Roers, PA [address] [place], [place] [address]  Chief Complaint  Patient presents with  . Flank Pain  . Results    CT    HPI: Patient is a 35 year old male presenting today after being seen in urgent care last night for acute onset of left flank pain. He reports that he has been experiencing nausea and vomiting for the last 4 days which he has been controlling with by mouth Zofran that he hasn't had available at home. He began experiencing severe left flank pain yesterday. They were unable to perform a CT or other imaging studies at the urgent care. He was discharged with Flomax, Vicodin and a urine strainer.  His pain is really and reasonably well controlled with Vicodin. He has been straining his urine, pushing fluids and taking Zofran as needed. He presents today with continued pain. He denies any urinary symptoms including frequency, urgency, gross hematuria or dysuria. He has not experienced any fevers. Although there is exacerbating or alleviating factors. No other exacerbating or alleviating factors.  No previous hx of stones.  0-5 on UA last night.    Interval History 09/30/15 Patient presents to review CT Urogram results.  He reports that pain resolved after his CT was performed.  He denies any continued nausea.  He has experienced no gross hematuria or any other urinary symptoms since his pain resolved. He has not seen the stone pass but states that he has forgotten to use his urine strainer a few times at night.  PMH: Past Medical History  Diagnosis Date  . Asthma   . Seasonal allergies   . Gout     Surgical History: Past Surgical History  Procedure Laterality Date  . Mouth surgery      Home Medications:    Medication List       This list is accurate as of: 09/30/15  4:53 PM.  Always use your most recent med list.               azelastine 0.05 % ophthalmic solution   Commonly known as:  OPTIVAR  Place 1 drop into both eyes 2 (two) times daily.     colchicine 0.6 MG tablet  Take 2 tablets (1.2 mg total) by mouth once. May take 0.6mg  tab 1 hour after loading dose; may repeat dosing in 3 days if needed     fexofenadine 180 MG tablet  Commonly known as:  ALLEGRA  Take 180 mg by mouth daily.     Hydrocodone-Acetaminophen 5-300 MG Tabs  Commonly known as:  VICODIN  1 tablet every 6 hours for sever pain     mometasone 50 MCG/ACT nasal spray  Commonly known as:  NASONEX  Place 2 sprays into the nose daily.     montelukast 10 MG tablet  Commonly known as:  SINGULAIR  Take 10 mg by mouth at bedtime.        Allergies:  Allergies  Allergen Reactions  . Gluten Meal Anaphylaxis  . Nsaids Anaphylaxis  . Aspirin Swelling    Family History: Family History  Problem Relation Age of Onset  . Hypertension Father     Social History:  reports that he has never smoked. He has never used smokeless tobacco. He reports that he does not drink alcohol or use illicit drugs.  ROS: UROLOGY Frequent Urination?: No Hard to postpone urination?: No Burning/pain with urination?: No Get up at night to urinate?:  No Leakage of urine?: No Urine stream starts and stops?: No Trouble starting stream?: No Do you have to strain to urinate?: No Blood in urine?: No Urinary tract infection?: No Sexually transmitted disease?: No Injury to kidneys or bladder?: No Painful intercourse?: No Weak stream?: No Erection problems?: No Penile pain?: No  Gastrointestinal Nausea?: Yes Vomiting?: No Indigestion/heartburn?: No Diarrhea?: No Constipation?: No  Constitutional Fever: No Night sweats?: No Weight loss?: No Fatigue?: Yes  Skin Skin rash/lesions?: No Itching?: No  Eyes Blurred vision?: No Double vision?: No  Ears/Nose/Throat Sore throat?: No Sinus problems?: No  Hematologic/Lymphatic Swollen glands?: No Easy bruising?: No  Cardiovascular Leg  swelling?: No Chest pain?: No  Respiratory Cough?: No Shortness of breath?: No  Endocrine Excessive thirst?: No  Musculoskeletal Back pain?: No Joint pain?: No  Neurological Headaches?: No Dizziness?: No  Psychologic Depression?: No Anxiety?: No  Physical Exam: BP 147/85 mmHg  Pulse 69  Resp 16  Ht  (1.778 m)  Wt 193 lb 1.6 oz (87.59 kg)  BMI 27.71 kg/m2  Constitutional:  Alert and oriented, No acute distress. HEENT: [place] AT, moist mucus membranes.  Trachea midline, no masses. Cardiovascular: No clubbing, cyanosis, or edema. Respiratory: Normal respiratory effort, no increased work of breathing. Skin: No rashes, bruises or suspicious lesions. Neurologic: Grossly intact, no focal deficits, moving all 4 extremities. Psychiatric: Normal mood and affect.  Laboratory Data:  Lab Results  Component Value Date   WBC 6.5 01/30/2015   HGB 15.0 01/30/2015   HCT 44.3 01/30/2015   MCV 88.6 01/30/2015   PLT 225 01/30/2015    Lab Results  Component Value Date   CREATININE 1.24 01/30/2015    No results found for: PSA  No results found for: TESTOSTERONE  No results found for: HGBA1C  Urinalysis CLINICAL DATA: Patient with nausea and vomiting. Left flank pain. Gross and microscopic hematuria.  EXAM: CT ABDOMEN AND PELVIS WITHOUT AND WITH CONTRAST  TECHNIQUE: Multidetector CT imaging of the abdomen and pelvis was performed following the standard protocol before and following the bolus administration of intravenous contrast.  CONTRAST: OMNIPAQUE IOHEXOL 350 MG/ML SOLN  COMPARISON: None.  FINDINGS: Lower chest: The heart is normal in size. No pericardial effusion. No consolidative pulmonary opacities. No pleural effusion or pneumothorax.  Hepatobiliary: Liver is normal in size and contour. No focal lesion identified. Gallbladder is unremarkable.  Pancreas: Fatty atrophy of the pancreatic parenchyma.  Spleen:  Unremarkable.  Adrenals/Urinary Tract: The adrenal glands are normal. There is moderate left hydroureteronephrosis to the level of the distal ureter were there is an obstructing 4 mm stone (image 82; series 3). 2 mm stone inferior pole left kidney. The left kidney is enlarged and there is delayed enhancement. The right kidney is normal in size, demonstrates appropriate enhancement and excretion of contrast into the renal collecting system and ureter. No abnormal filling defects identified within the right ureter.  Stomach/Bowel: No abnormal bowel wall thickening or evidence for bowel obstruction. The appendix is normal. No free fluid or free intraperitoneal air. Normal morphology of the stomach.  Vascular/Lymphatic: Normal caliber abdominal aorta. No retroperitoneal lymphadenopathy.  Other: Prostate is unremarkable.  Musculoskeletal: Probable bone island left femoral head. No aggressive or acute appearing osseous lesions.  IMPRESSION: There is an obstructing 4 mm stone within the distal left ureter at the UVJ resulting in moderate left hydroureteronephrosis and delayed enhancement of the left kidney.  Pertinent Imaging:   Assessment & Plan:    1. Flank pain-  Resolved.  Patient has been straining his urine but has not seen the stone passed. I suspect the stone has either past unnoticed or reside in his bladder at this time. I encouraged him to continue to strain urine and push fluids. We will follow up with her renal ultrasound to ensure that hydronephrosis has resolved. - Urinalysis, Complete -Renal US  2.  Nephrolithiasis-  4 mm obstructing distal left ureteral stone resulting in moderate left hydroureter dear hydronephrosis as well as a 2 mm inferior pole stone in the left kidney.   Pain resolved and obstructing stone has most likely passed as described above.  3. Microscopic hematuria-  Resolved. Likely from obstructing renal stone.  Return in about 2 weeks (around  10/14/2015) for RUS results.  These notes generated with voice recognition software. I apologize for typographical errors.  Earlie Lou, FNP  [facility] [address], [address] [place], [place] [address] [phone]

## 2015-10-01 LAB — URINALYSIS, COMPLETE
BILIRUBIN UA: NEGATIVE
GLUCOSE, UA: NEGATIVE
LEUKOCYTES UA: NEGATIVE
Nitrite, UA: NEGATIVE
Specific Gravity, UA: 1.02 (ref 1.005–1.030)
UUROB: 0.2 mg/dL (ref 0.2–1.0)
pH, UA: 6.5 (ref 5.0–7.5)

## 2015-10-01 LAB — MICROSCOPIC EXAMINATION
BACTERIA UA: NONE SEEN
Epithelial Cells (non renal): NONE SEEN /hpf (ref 0–10)

## 2015-10-02 LAB — CULTURE, URINE COMPREHENSIVE

## 2015-10-08 ENCOUNTER — Telehealth: Payer: Self-pay

## 2015-10-08 ENCOUNTER — Encounter: Payer: Self-pay | Admitting: Obstetrics and Gynecology

## 2015-10-08 NOTE — Telephone Encounter (Signed)
Hi, I received tests results from microbial culture that sounds like I may need antibiotic treatment. Please let me know if you are prescribing something and if I need to pick it up or if it will be sent directly to the pharmacy.         Thank you,    Dean Ellis    Sent via [place]

## 2015-10-09 ENCOUNTER — Ambulatory Visit
Admission: RE | Admit: 2015-10-09 | Discharge: 2015-10-09 | Disposition: A | Discharge status: Home / Self Care | Payer: BLUE CROSS/BLUE SHIELD | Source: Ambulatory Visit | Attending: Obstetrics and Gynecology | Admitting: Obstetrics and Gynecology

## 2015-10-09 ENCOUNTER — Ambulatory Visit: Payer: BLUE CROSS/BLUE SHIELD | Admitting: Obstetrics and Gynecology

## 2015-10-09 DIAGNOSIS — N200 Calculus of kidney: Secondary | ICD-10-CM

## 2015-10-09 DIAGNOSIS — R109 Unspecified abdominal pain: Secondary | ICD-10-CM

## 2015-10-09 NOTE — Telephone Encounter (Signed)
There was very minimal bacterial growth on the urine culture which is consistent with some skin contamination and not infection. Patient does not need an antibiotic

## 2015-10-11 NOTE — Telephone Encounter (Signed)
Information sent back to pt via mychart.

## 2015-10-14 ENCOUNTER — Ambulatory Visit (INDEPENDENT_AMBULATORY_CARE_PROVIDER_SITE_OTHER): Payer: BLUE CROSS/BLUE SHIELD | Admitting: Obstetrics and Gynecology

## 2015-10-14 ENCOUNTER — Encounter: Payer: Self-pay | Admitting: Obstetrics and Gynecology

## 2015-10-14 VITALS — BP 148/97 | HR 66 | Resp 16 | Ht 70.0 in | Wt 194.1 lb

## 2015-10-14 DIAGNOSIS — N200 Calculus of kidney: Secondary | ICD-10-CM

## 2015-10-14 NOTE — Progress Notes (Signed)
8:39 AM   Dean Ellis [birth year] [id number]  Referring provider: Tamsen Roers, PA [address] [place], [place] [address]  No chief complaint on file.   HPI: Patient is a 35 year old male presenting today after being seen in urgent care last night for acute onset of left flank pain. He reports that he has been experiencing nausea and vomiting for the last 4 days which he has been controlling with by mouth Zofran that he hasn't had available at home. He began experiencing severe left flank pain yesterday. They were unable to perform a CT or other imaging studies at the urgent care. He was discharged with Flomax, Vicodin and a urine strainer.  His pain is really and reasonably well controlled with Vicodin. He has been straining his urine, pushing fluids and taking Zofran as needed. He presents today with continued pain. He denies any urinary symptoms including frequency, urgency, gross hematuria or dysuria. He has not experienced any fevers. Although there is exacerbating or alleviating factors. No other exacerbating or alleviating factors.  No previous hx of stones.  0-5 on UA last night.    Interval History 09/30/15 Patient presents to review CT Urogram results.  He reports that pain resolved after his CT was performed.  He denies any continued nausea.  He has experienced no gross hematuria or any other urinary symptoms since his pain resolved. He has not seen the stone pass but states that he has forgotten to use his urine strainer a few times at night.  F/u RUS demonstrating no hydronephrosis.  PMH: Past Medical History  Diagnosis Date  . Asthma   . Seasonal allergies   . Gout     Surgical History: Past Surgical History  Procedure Laterality Date  . Mouth surgery      Home Medications:    Medication List       This list is accurate as of: 10/14/15 11:59 PM.  Always use your most recent med list.               azelastine 0.05 % ophthalmic solution  Commonly  known as:  OPTIVAR  Place 1 drop into both eyes 2 (two) times daily.     butalbital-acetaminophen-caffeine 50-325-40 MG tablet  Commonly known as:  FIORICET, ESGIC     colchicine 0.6 MG tablet  Take 2 tablets (1.2 mg total) by mouth once. May take 0.6mg  tab 1 hour after loading dose; may repeat dosing in 3 days if needed     fexofenadine 180 MG tablet  Commonly known as:  ALLEGRA  Take 180 mg by mouth daily.     Hydrocodone-Acetaminophen 5-300 MG Tabs  Commonly known as:  VICODIN  1 tablet every 6 hours for sever pain     mometasone 50 MCG/ACT nasal spray  Commonly known as:  NASONEX  Place 2 sprays into the nose daily.     montelukast 10 MG tablet  Commonly known as:  SINGULAIR  Take 10 mg by mouth at bedtime.        Allergies:  Allergies  Allergen Reactions  . Gluten Meal Anaphylaxis  . Nsaids Anaphylaxis  . Aspirin Swelling    Family History: Family History  Problem Relation Age of Onset  . Hypertension Father     Social History:  reports that he has never smoked. He has never used smokeless tobacco. He reports that he does not drink alcohol or use illicit drugs.  ROS: UROLOGY Frequent Urination?: No Hard to postpone urination?: No Burning/pain with urination?:  No Get up at night to urinate?: No Leakage of urine?: No Urine stream starts and stops?: No Trouble starting stream?: No Do you have to strain to urinate?: No Blood in urine?: No Urinary tract infection?: No Sexually transmitted disease?: No Injury to kidneys or bladder?: No Painful intercourse?: No Weak stream?: No Erection problems?: No Penile pain?: No  Gastrointestinal Nausea?: No Vomiting?: No Indigestion/heartburn?: No Diarrhea?: No Constipation?: No  Constitutional Fever: No Night sweats?: No Weight loss?: No Fatigue?: No  Skin Skin rash/lesions?: No Itching?: No  Eyes Blurred vision?: No Double vision?: No  Ears/Nose/Throat Sore throat?: No Sinus problems?:  No  Hematologic/Lymphatic Swollen glands?: No Easy bruising?: No  Cardiovascular Leg swelling?: No Chest pain?: No  Respiratory Cough?: No Shortness of breath?: No  Endocrine Excessive thirst?: No  Musculoskeletal Back pain?: No Joint pain?: No  Neurological Headaches?: No Dizziness?: No  Psychologic Depression?: No Anxiety?: No  Physical Exam: BP 148/97 mmHg  Pulse 66  Resp 16  Ht  (1.778 m)  Wt 194 lb 1.6 oz (88.043 kg)  BMI 27.85 kg/m2  Constitutional:  Alert and oriented, No acute distress. HEENT: [place] AT, moist mucus membranes.  Trachea midline, no masses. Cardiovascular: No clubbing, cyanosis, or edema. Respiratory: Normal respiratory effort, no increased work of breathing. Skin: No rashes, bruises or suspicious lesions. Neurologic: Grossly intact, no focal deficits, moving all 4 extremities. Psychiatric: Normal mood and affect.  Laboratory Data:  Lab Results  Component Value Date   WBC 6.5 01/30/2015   HGB 15.0 01/30/2015   HCT 44.3 01/30/2015   MCV 88.6 01/30/2015   PLT 225 01/30/2015    Lab Results  Component Value Date   CREATININE 1.24 01/30/2015    No results found for: PSA  No results found for: TESTOSTERONE  No results found for: HGBA1C  Urinalysis   Pertinent Imaging: CT IMPRESSION: There is an obstructing 4 mm stone within the distal left ureter at the UVJ resulting in moderate left hydroureteronephrosis and delayed enhancement of the left kidney.  EXAM: RENAL / URINARY TRACT ULTRASOUND COMPLETE  COMPARISON: Abdominal pelvic CT scan dated September 27, 2015  FINDINGS: Right Kidney:  Length: 11.6 cm. Echogenicity within normal limits. No mass or hydronephrosis visualized.  Left Kidney:  Length: 11.2 cm. In the upper pole there is a shadowing echogenic focus measuring 5 x 4 x 6 mm. There is no hydronephrosis. The renal cortical echotexture is normal.  Bladder:  Bilateral ureteral jets are  observed. Urinary bladder is moderately distended.  IMPRESSION: 1. No hydronephrosis remains on the left. However, there is an approximately 6 x 4 x 5 mm echogenic shadowing focus in the upper pole of the left kidney which likely reflects a stone. 2. Normal appearance of the right kidney. 3. Bilateral ureteral jets are observed consistent with passage of the previously demonstrated distal left ureteral stone.  Assessment & Plan:    1. Flank pain-  Resolved.   - Urinalysis, Complete  2.  Nephrolithiasis-  4 mm obstructing distal left ureteral stone resulting in moderate left hydroureter dear hydronephrosis as well as a 2 mm inferior pole stone in the left kidney.   Pain resolved and obstructing stone has most likely passed as described above.  RUS demonstrating resolution of hydronephrosis.  3. Microscopic hematuria-  Resolved. Likely from obstructing renal stone.  Return if symptoms worsen or fail to improve.  These notes generated with voice recognition software. I apologize for typographical errors.  Earlie Lou, FNP  [facility] [address], [place], [place] [address] [phone]

## 2016-09-17 IMAGING — US US RENAL
1 series · 14 of 25 positions shown · non-contrast
Comparison: Abdominal pelvic CT scan dated September 27, 2015

CLINICAL DATA: Left flank pain, known hydronephrosis due to a 4mm
diameter left ureterovesical junction stone.

EXAM:
RENAL / URINARY TRACT ULTRASOUND COMPLETE

[Series 1: us renal · 0.25mm/px · 14 of 41 slices shown]
[im 1/41]
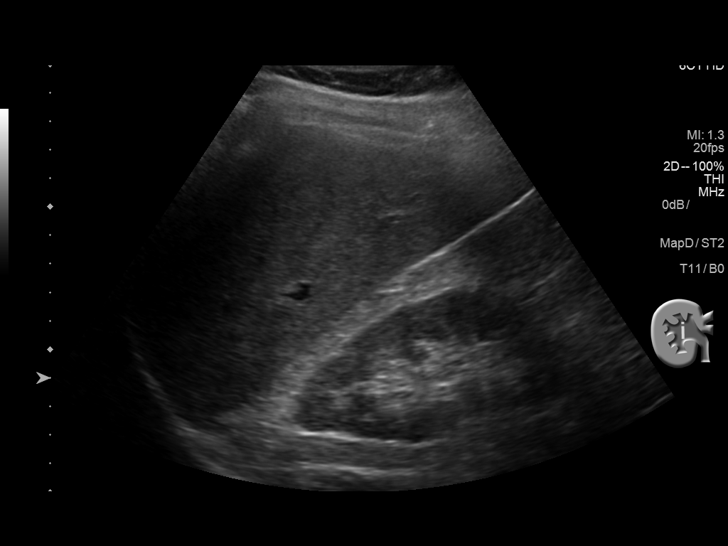
[im 4/41]
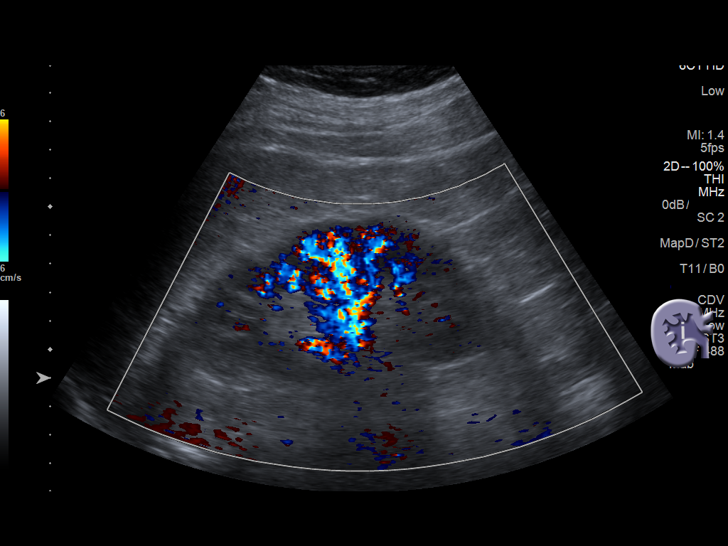
[im 7/41]
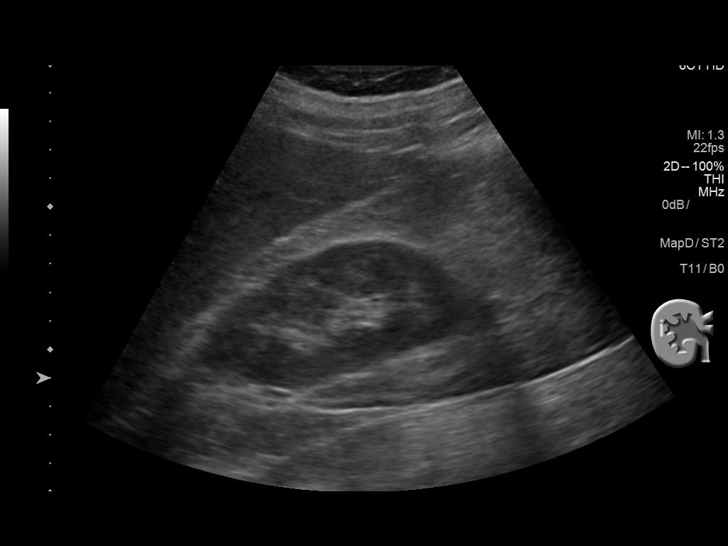
[im 11/41]
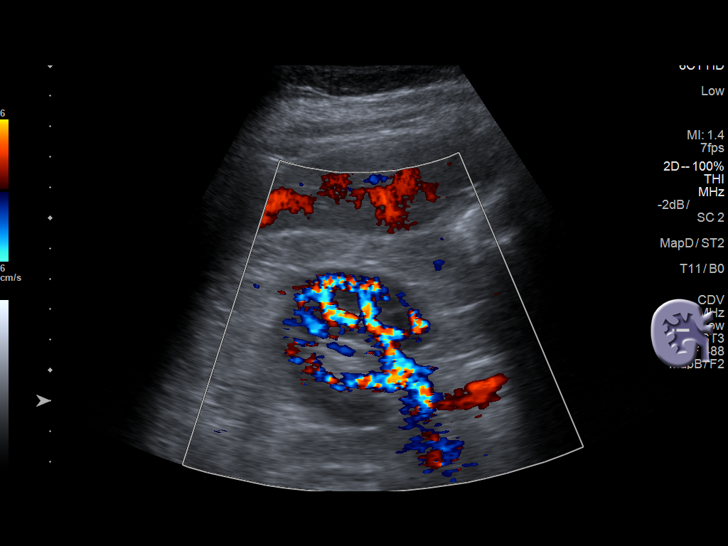
[im 14/41]
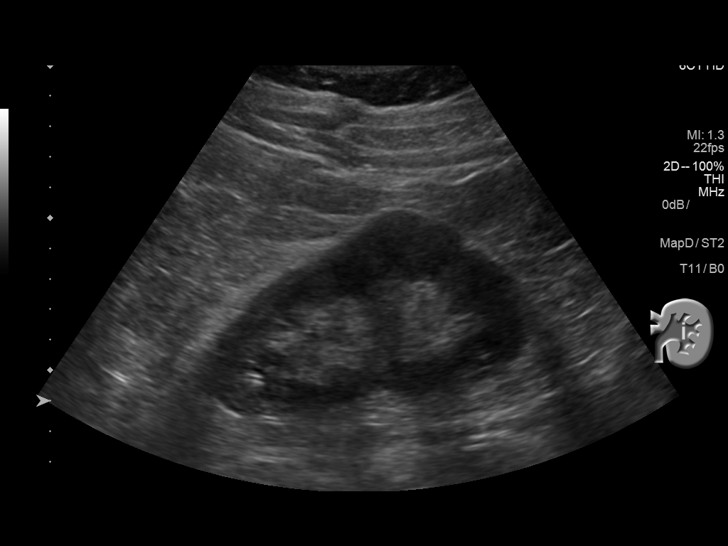
[im 16/41]
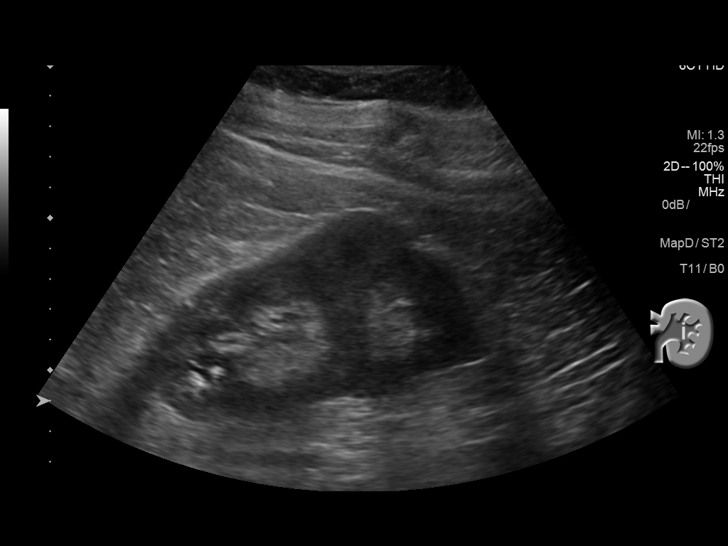
[im 19/41]
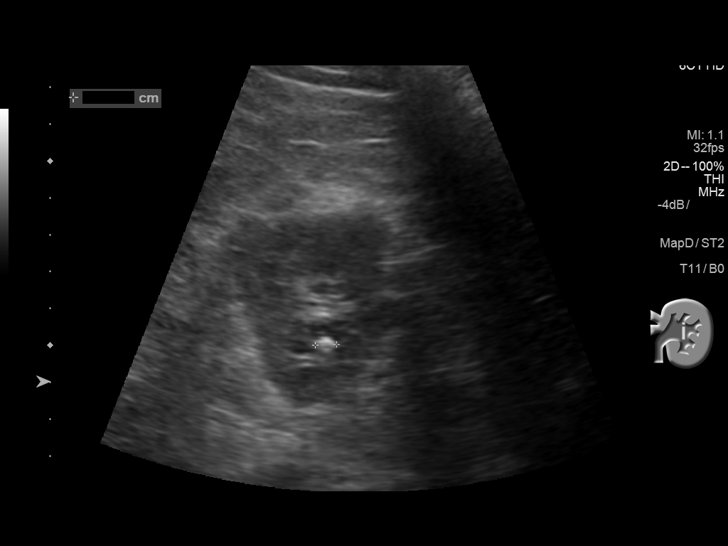
[im 22/41]
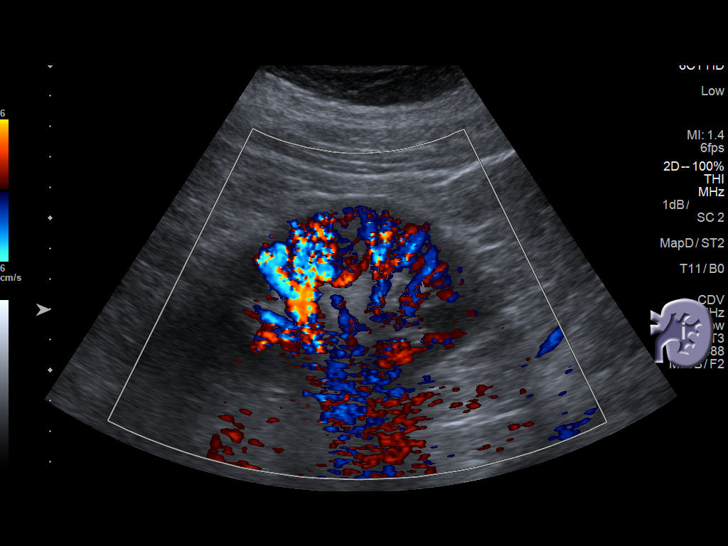
[im 26/41]
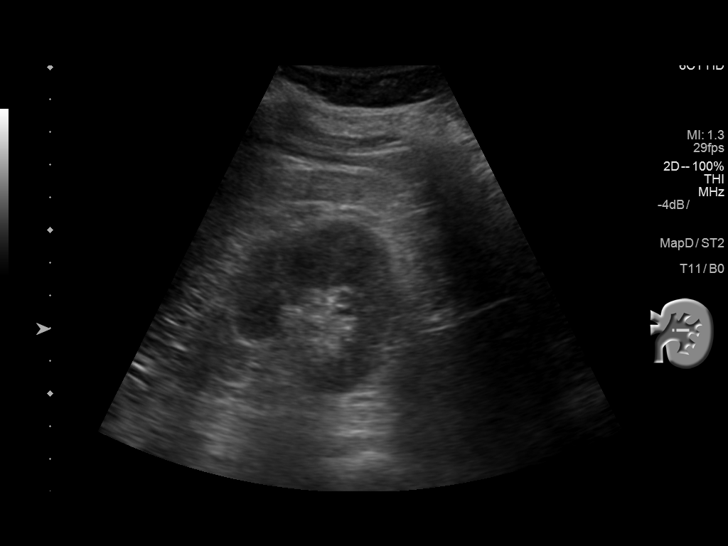
[im 27/41]
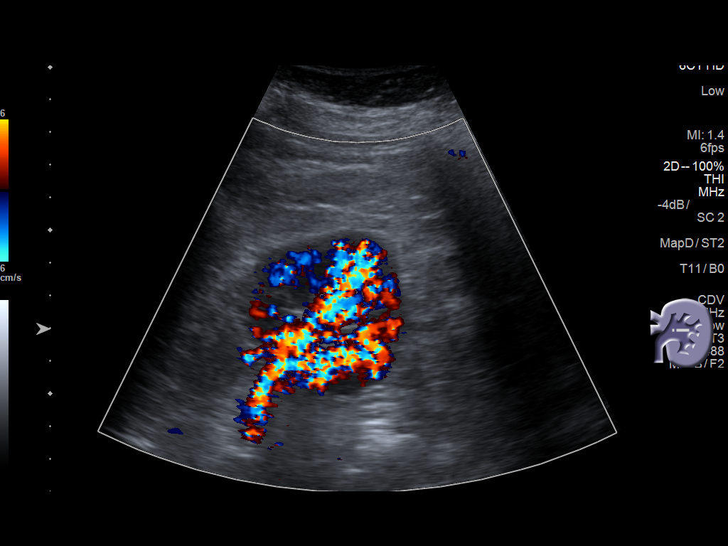
[im 31/41]
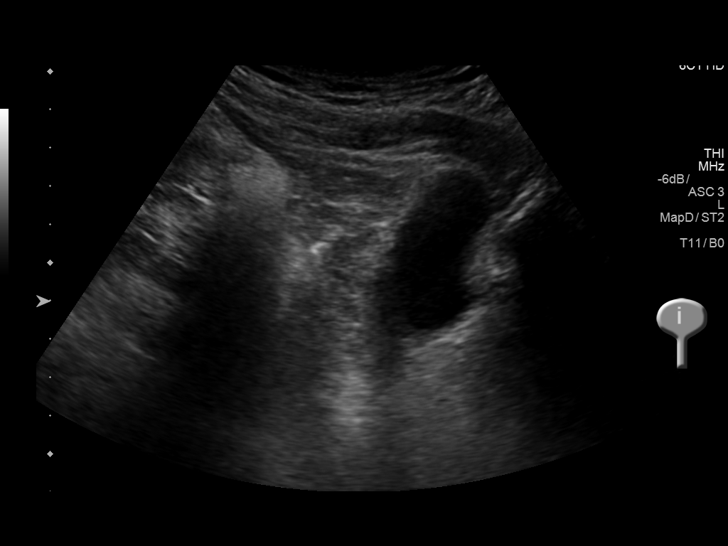
[im 34/41]
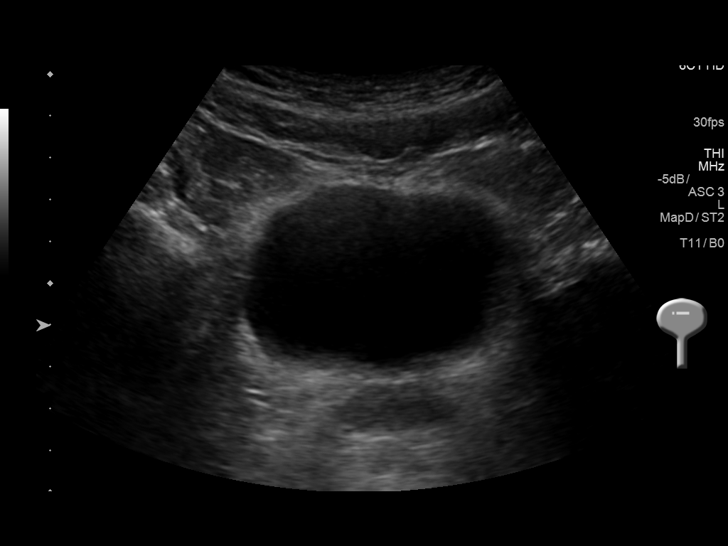
[im 37/41]
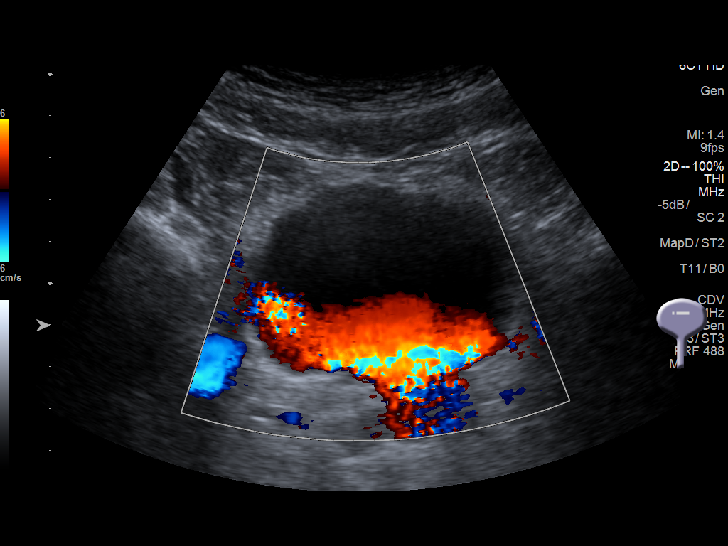
[im 41/41]
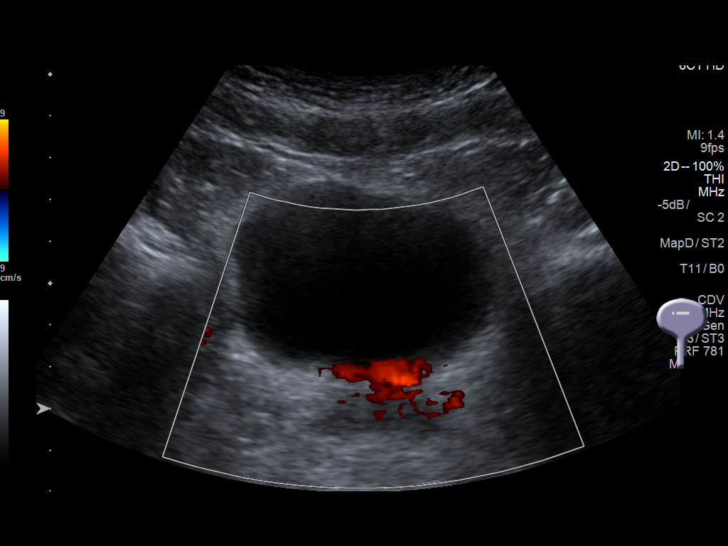

[14 of 25 positions shown; findings below may reference images not displayed]

FINDINGS: Right Kidney:

Length: 11.6 cm. Echogenicity within normal limits. No mass or
hydronephrosis visualized.

Left Kidney:

Length: 11.2 cm. In the upper pole there is a shadowing echogenic
focus measuring 5 x 4 x 6 mm. There is no hydronephrosis. The renal
cortical echotexture is normal.

Bladder:

Bilateral ureteral jets are observed. Urinary bladder is moderately
distended.
IMPRESSION: 1. No hydronephrosis remains on the left. However, there is an
approximately 6 x 4 x 5 mm echogenic shadowing focus in the upper
pole of the left kidney which likely reflects a stone.
2. Normal appearance of the right kidney.
3. Bilateral ureteral jets are observed consistent with passage of
the previously demonstrated distal left ureteral stone.

## 2017-08-31 ENCOUNTER — Other Ambulatory Visit: Payer: Self-pay | Admitting: Internal Medicine

## 2017-08-31 DIAGNOSIS — Z0000 Encounter for general adult medical examination without abnormal findings: Secondary | ICD-10-CM

## 2017-09-02 ENCOUNTER — Other Ambulatory Visit: Payer: Self-pay | Admitting: Internal Medicine

## 2017-09-02 ENCOUNTER — Ambulatory Visit
Admission: RE | Admit: 2017-09-02 | Discharge: 2017-09-02 | Disposition: A | Discharge status: Home / Self Care | Payer: BLUE CROSS/BLUE SHIELD | Source: Ambulatory Visit | Attending: Internal Medicine | Admitting: Internal Medicine

## 2017-09-02 DIAGNOSIS — I10 Essential (primary) hypertension: Secondary | ICD-10-CM | POA: Diagnosis not present

## 2017-09-02 DIAGNOSIS — Z0000 Encounter for general adult medical examination without abnormal findings: Secondary | ICD-10-CM

## 2017-09-02 DIAGNOSIS — H539 Unspecified visual disturbance: Secondary | ICD-10-CM | POA: Insufficient documentation

## 2017-09-02 DIAGNOSIS — E785 Hyperlipidemia, unspecified: Secondary | ICD-10-CM | POA: Diagnosis not present

## 2017-09-02 DIAGNOSIS — Z8249 Family history of ischemic heart disease and other diseases of the circulatory system: Secondary | ICD-10-CM | POA: Diagnosis not present

## 2017-09-02 DIAGNOSIS — I6523 Occlusion and stenosis of bilateral carotid arteries: Secondary | ICD-10-CM | POA: Insufficient documentation

## 2017-11-30 IMAGING — CT CT ABD-PEL WO/W CM
1 of 2 series · 14 of 32 positions shown, 19 images · IV contrast (APPLIED)
Comparison: None.

CLINICAL DATA: Patient with nausea and vomiting. Left flank pain.
Gross and microscopic hematuria.

EXAM:
CT ABDOMEN AND PELVIS WITHOUT AND WITH CONTRAST
TECHNIQUE: Multidetector CT imaging of the abdomen and pelvis was performed
following the standard protocol before and following the bolus
administration of intravenous contrast.
CONTRAST:  150mL OMNIPAQUE IOHEXOL 350 MG/ML SOLN

[Series 7: axial delay · axial · delayed · 0.77mm/px · z∈[-1033,-518]mm · 14 of 119 slices shown, 19 images]
[im 8/119  soft-tissue]
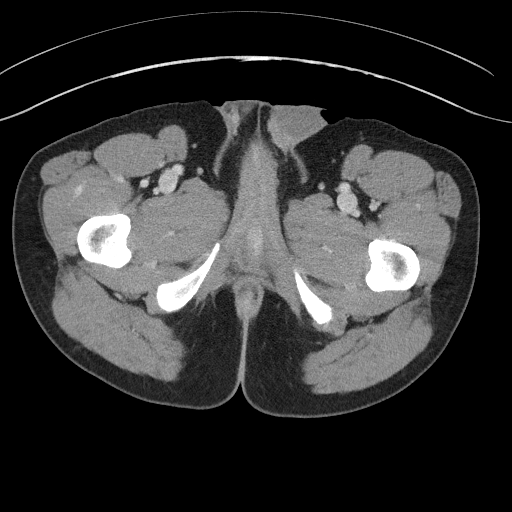
[im 8/119  bone]
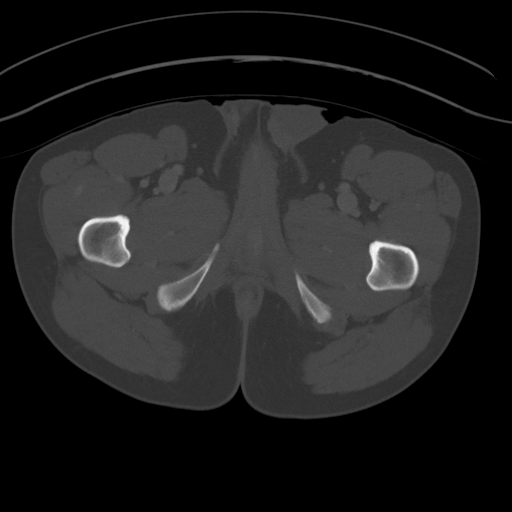
[im 15/119  soft-tissue]
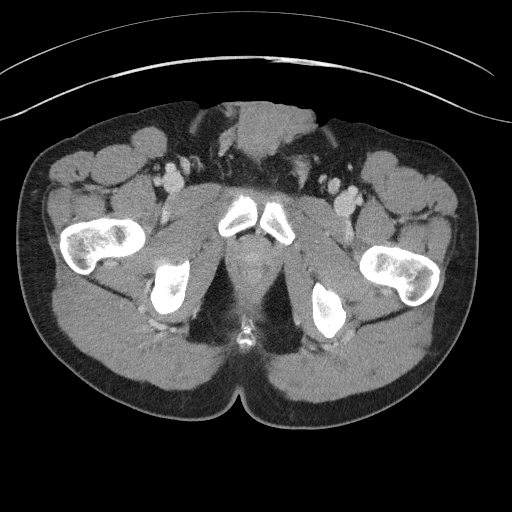
[im 23/119  soft-tissue]
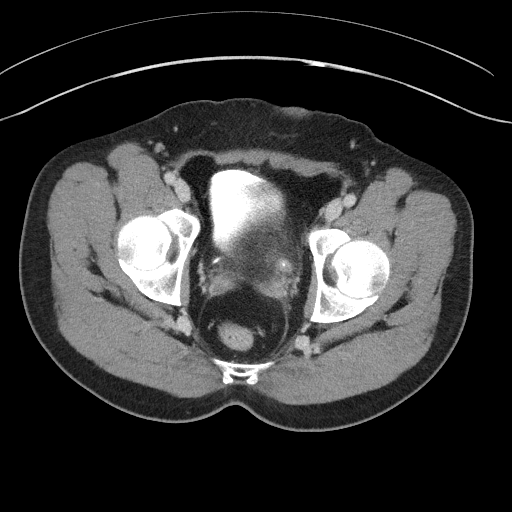
[im 37/119  soft-tissue]
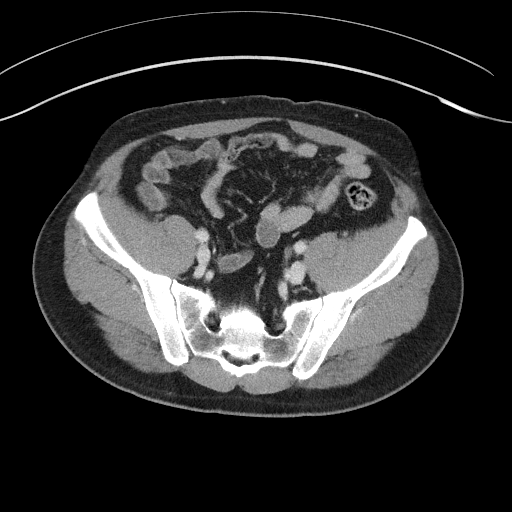
[im 45/119  soft-tissue]
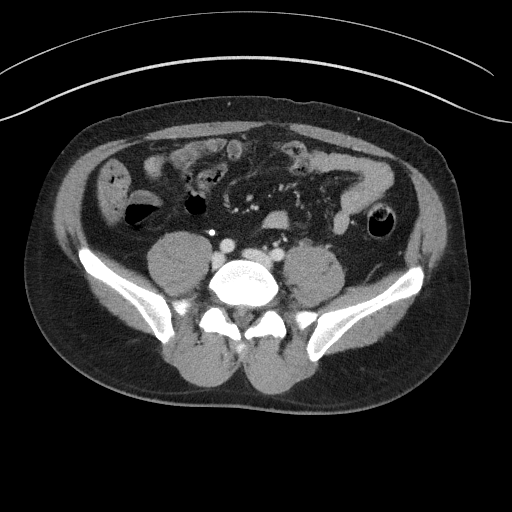
[im 52/119  soft-tissue]
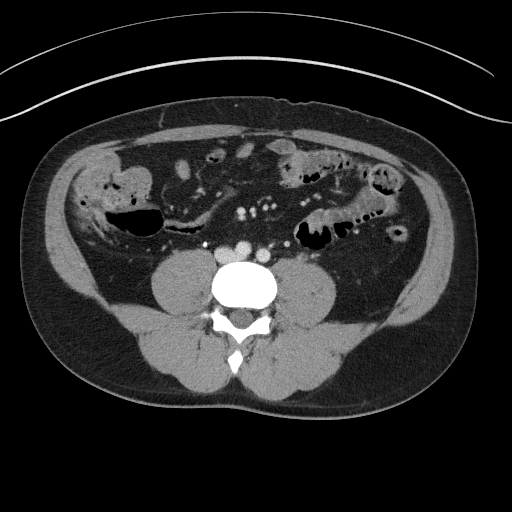
[im 60/119  soft-tissue]
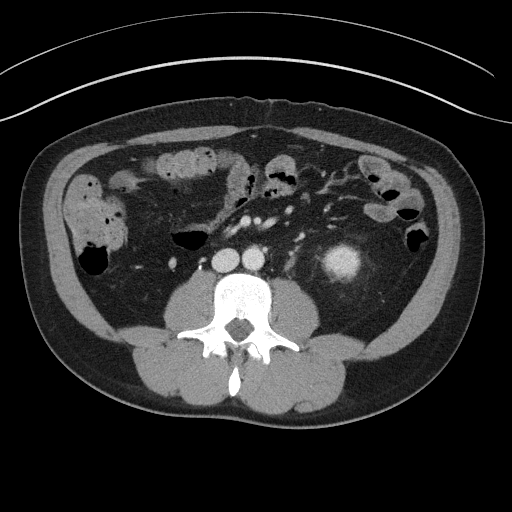
[im 67/119  soft-tissue]
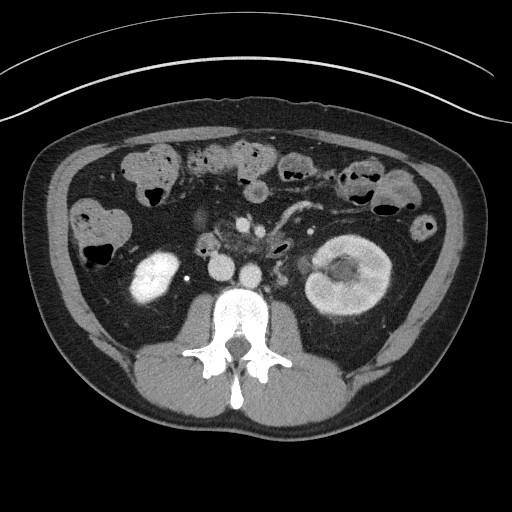
[im 74/119  soft-tissue]
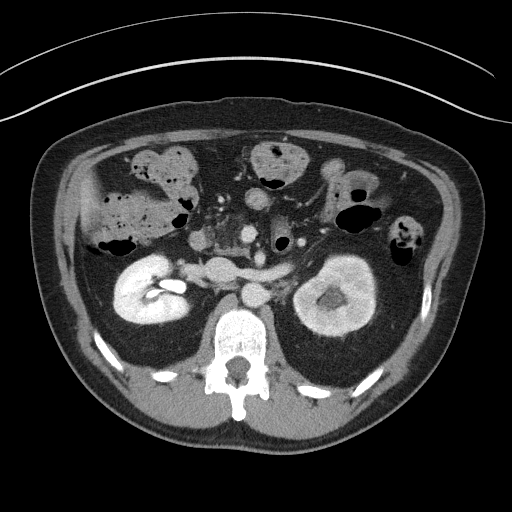
[im 74/119  bone]
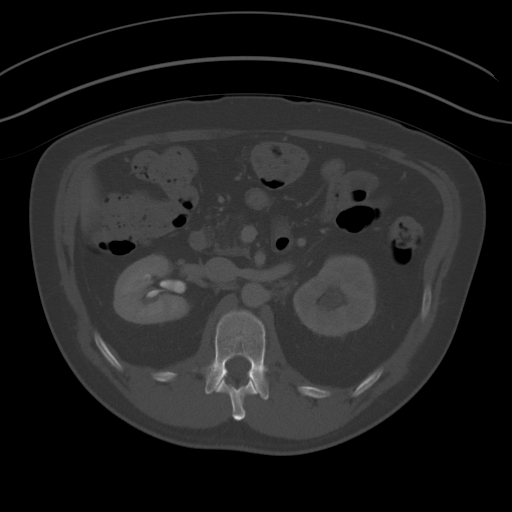
[im 82/119  soft-tissue]
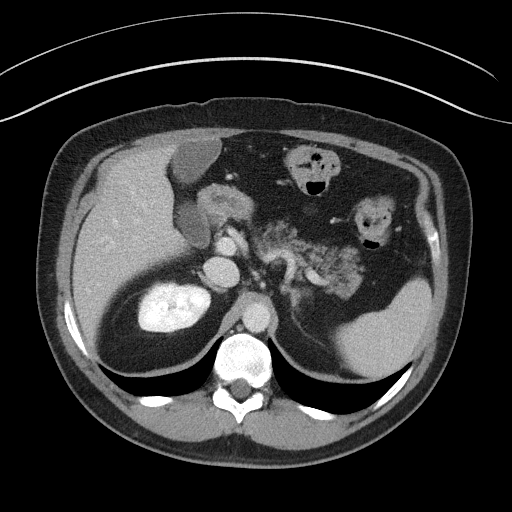
[im 89/119  lung]
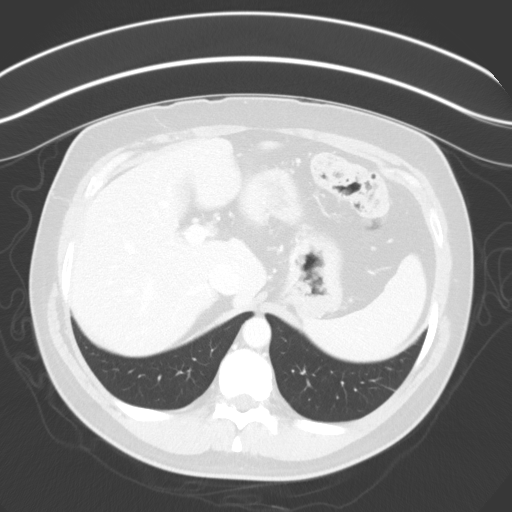
[im 96/119  soft-tissue]
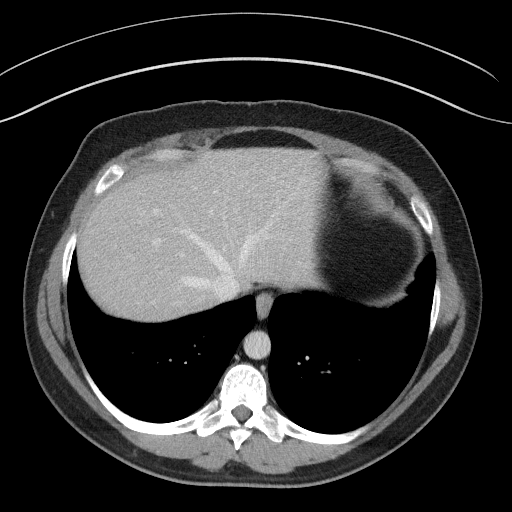
[im 96/119  lung]
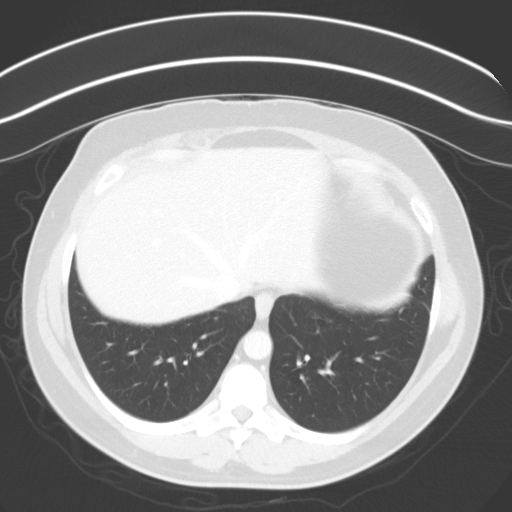
[im 104/119  soft-tissue]
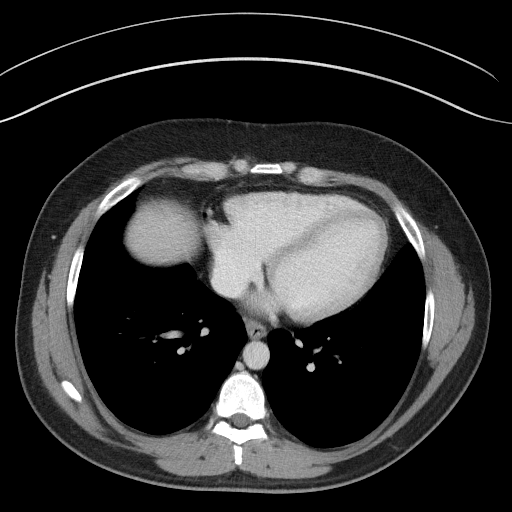
[im 104/119  lung]
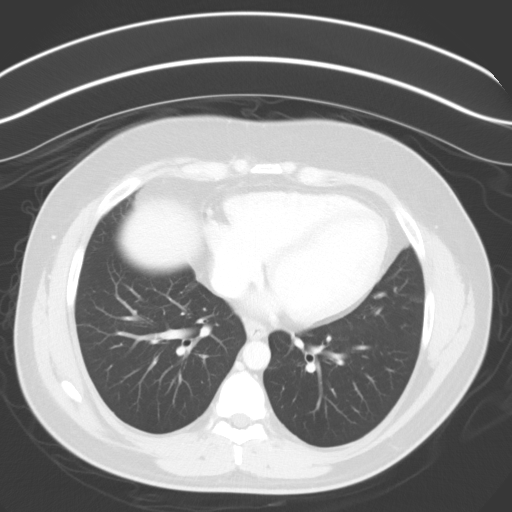
[im 111/119  soft-tissue]
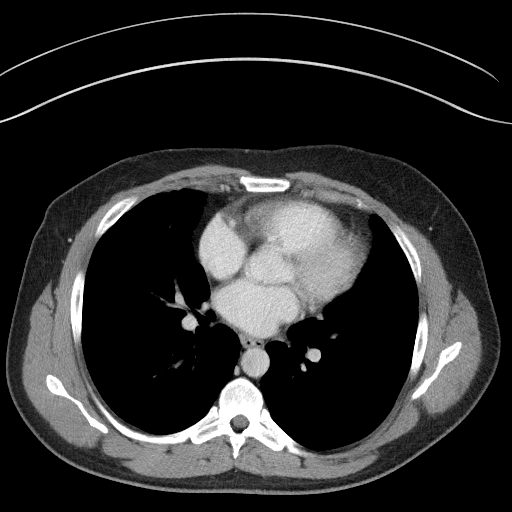
[im 111/119  lung]
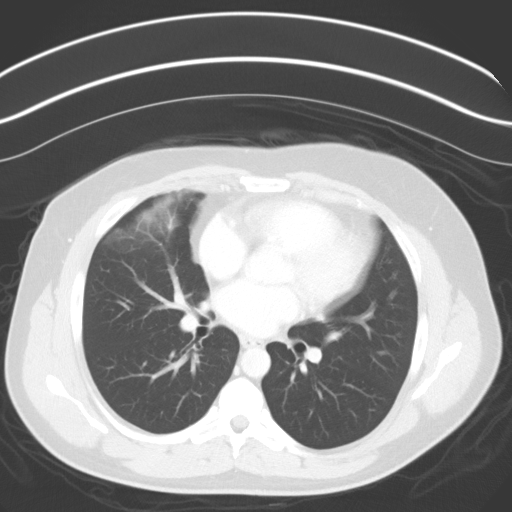

[14 of 32 positions shown; findings below may reference images not displayed]

FINDINGS: Lower chest: The heart is normal in size. No pericardial effusion.
No consolidative pulmonary opacities. No pleural effusion or
pneumothorax.

Hepatobiliary: Liver is normal in size and contour. No focal lesion
identified. Gallbladder is unremarkable.

Pancreas: Fatty atrophy of the pancreatic parenchyma.

Spleen: Unremarkable.

Adrenals/Urinary Tract: The adrenal glands are normal. There is
moderate left hydroureteronephrosis to the level of the distal
ureter were there is an obstructing 4 mm stone (image 82; series 3).
2 mm stone inferior pole left kidney. The left kidney is enlarged
and there is delayed enhancement. The right kidney is normal in
size, demonstrates appropriate enhancement and excretion of contrast
into the renal collecting system and ureter. No abnormal filling
defects identified within the right ureter.

Stomach/Bowel: No abnormal bowel wall thickening or evidence for
bowel obstruction. The appendix is normal. No free fluid or free
intraperitoneal air. Normal morphology of the stomach.

Vascular/Lymphatic: Normal caliber abdominal aorta. No
retroperitoneal lymphadenopathy.

Other: Prostate is unremarkable.

Musculoskeletal: Probable bone island left femoral head. No
aggressive or acute appearing osseous lesions.
IMPRESSION: There is an obstructing 4 mm stone within the distal left ureter at
the UVJ resulting in moderate left hydroureteronephrosis and delayed
enhancement of the left kidney.

These results will be called to the ordering clinician or
representative by the Radiologist Assistant, and communication
documented in the PACS or zVision Dashboard.

## 2018-03-29 ENCOUNTER — Other Ambulatory Visit
Admission: RE | Admit: 2018-03-29 | Discharge: 2018-03-29 | Disposition: A | Discharge status: Home / Self Care | Payer: BLUE CROSS/BLUE SHIELD | Source: Ambulatory Visit | Attending: Gastroenterology | Admitting: Gastroenterology

## 2018-03-29 DIAGNOSIS — K580 Irritable bowel syndrome with diarrhea: Secondary | ICD-10-CM | POA: Diagnosis not present

## 2018-03-29 LAB — GASTROINTESTINAL PANEL BY PCR, STOOL (REPLACES STOOL CULTURE)
ASTROVIRUS: NOT DETECTED
Adenovirus F40/41: NOT DETECTED
Campylobacter species: NOT DETECTED
Cryptosporidium: NOT DETECTED
Cyclospora cayetanensis: NOT DETECTED
ENTEROAGGREGATIVE E COLI (EAEC): NOT DETECTED
ENTEROPATHOGENIC E COLI (EPEC): NOT DETECTED
ENTEROTOXIGENIC E COLI (ETEC): NOT DETECTED
Entamoeba histolytica: NOT DETECTED
GIARDIA LAMBLIA: NOT DETECTED
NOROVIRUS GI/GII: NOT DETECTED
Plesimonas shigelloides: NOT DETECTED
ROTAVIRUS A: NOT DETECTED
SHIGELLA/ENTEROINVASIVE E COLI (EIEC): NOT DETECTED
Salmonella species: NOT DETECTED
Sapovirus (I, II, IV, and V): NOT DETECTED
Shiga like toxin producing E coli (STEC): NOT DETECTED
Vibrio cholerae: NOT DETECTED
Vibrio species: NOT DETECTED
Yersinia enterocolitica: NOT DETECTED

## 2018-03-29 LAB — C DIFFICILE QUICK SCREEN W PCR REFLEX
C DIFFICLE (CDIFF) ANTIGEN: POSITIVE — AB
C Diff interpretation: DETECTED
C Diff toxin: POSITIVE — AB

## 2018-04-29 ENCOUNTER — Other Ambulatory Visit
Admission: RE | Admit: 2018-04-29 | Discharge: 2018-04-29 | Disposition: A | Discharge status: Home / Self Care | Payer: BLUE CROSS/BLUE SHIELD | Source: Ambulatory Visit | Attending: Gastroenterology | Admitting: Gastroenterology

## 2018-04-29 DIAGNOSIS — K580 Irritable bowel syndrome with diarrhea: Secondary | ICD-10-CM | POA: Diagnosis not present

## 2018-04-29 LAB — C DIFFICILE QUICK SCREEN W PCR REFLEX
C Diff antigen: NEGATIVE
C Diff interpretation: NOT DETECTED
C Diff toxin: NEGATIVE

## 2018-08-12 IMAGING — US US CAROTID DUPLEX BILAT
1 series · 14 of 24 positions shown · non-contrast
Comparison: None.

CLINICAL DATA: Visual changes

EXAM:
BILATERAL CAROTID DUPLEX ULTRASOUND
TECHNIQUE: Gray scale imaging, color Doppler and duplex ultrasound were
performed of bilateral carotid and vertebral arteries in the neck.

[Series 1: us carotid duplex bilat · 14 of 62 slices shown]
[im 1/62]
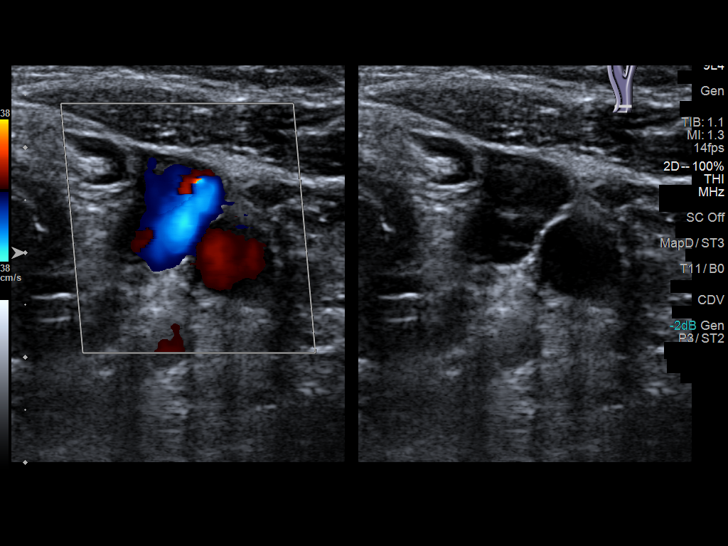
[im 6/62]
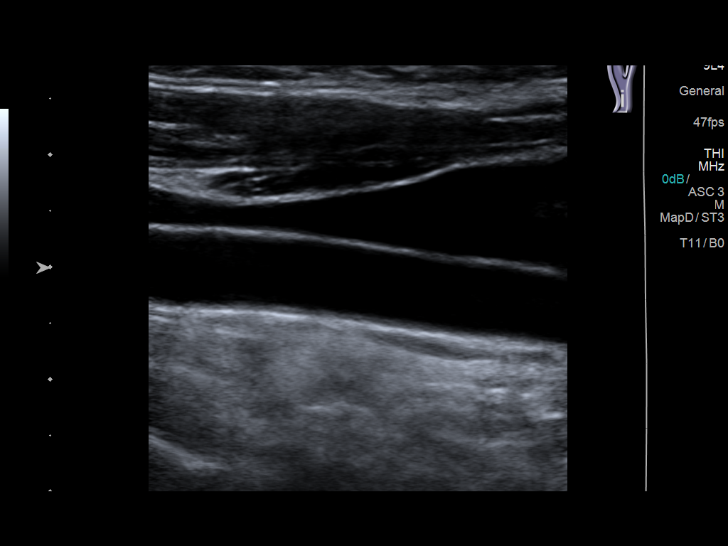
[im 11/62]
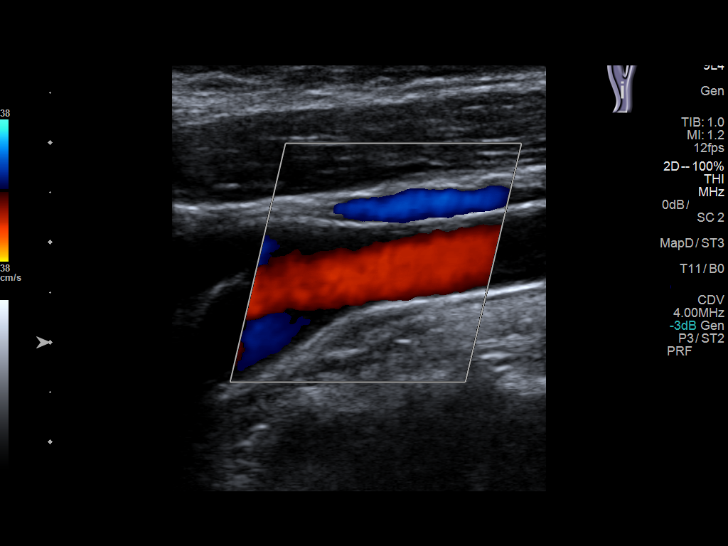
[im 16/62]
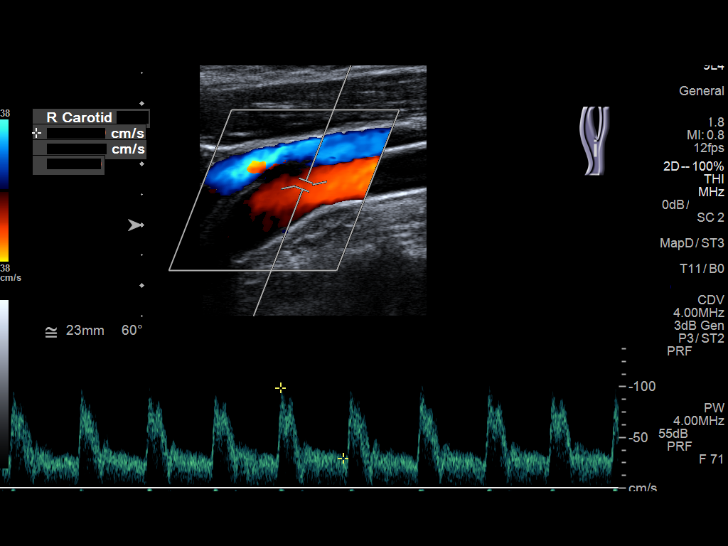
[im 19/62]
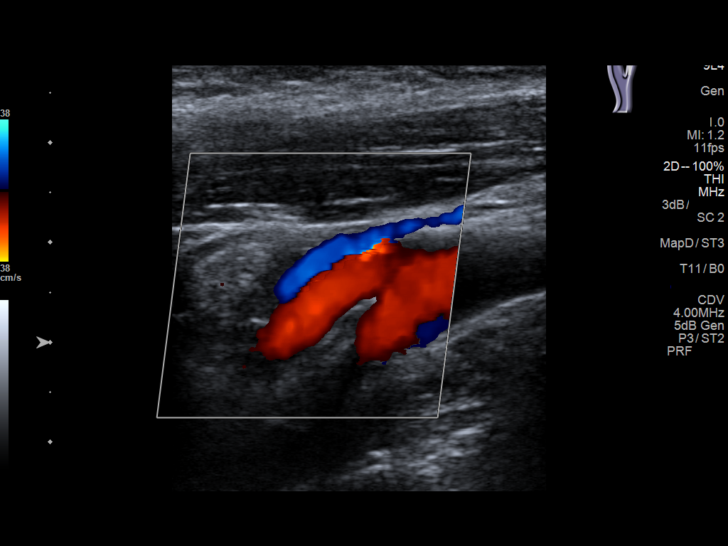
[im 24/62]
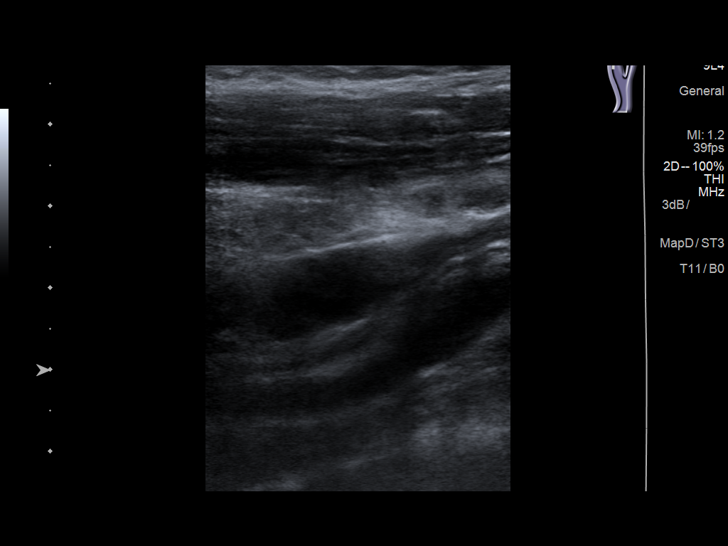
[im 30/62]
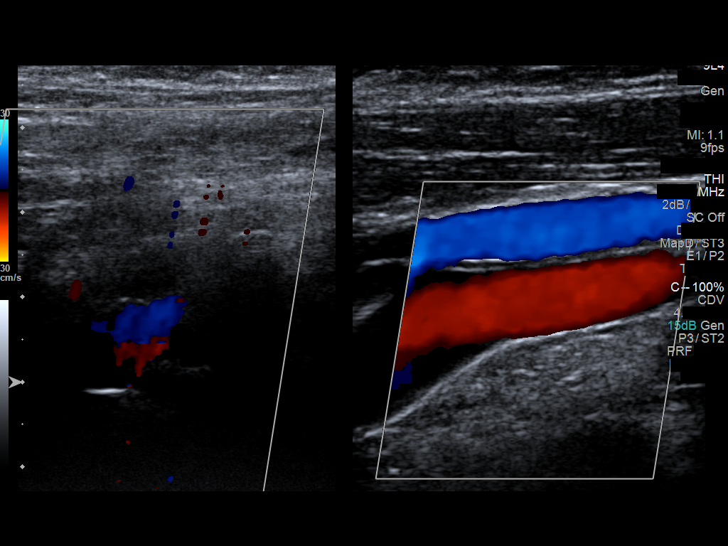
[im 32/62]
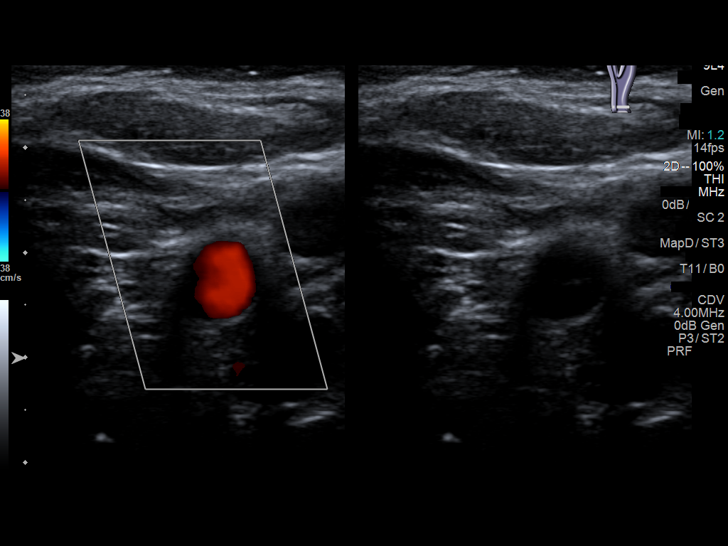
[im 38/62]
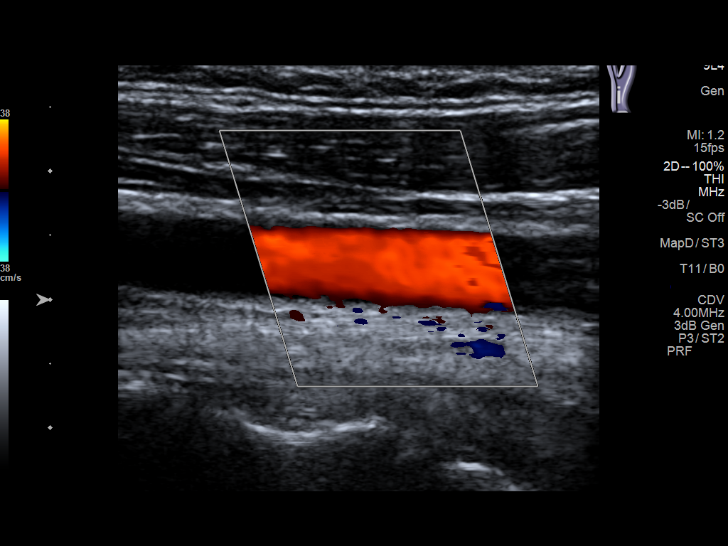
[im 43/62]
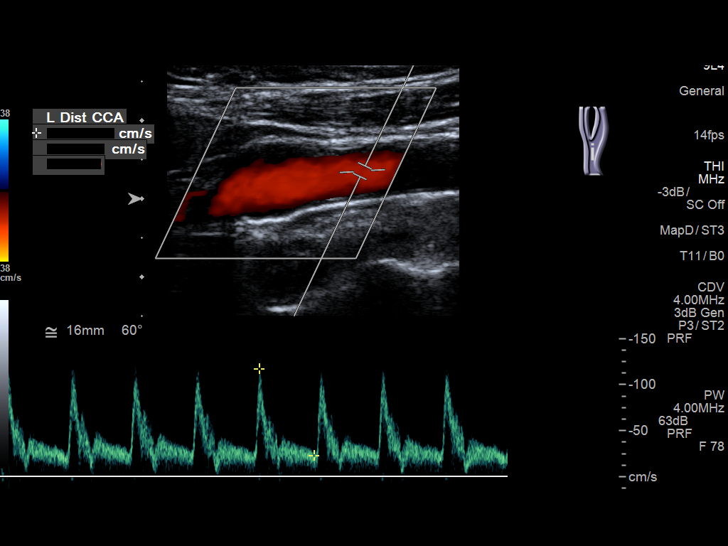
[im 48/62]
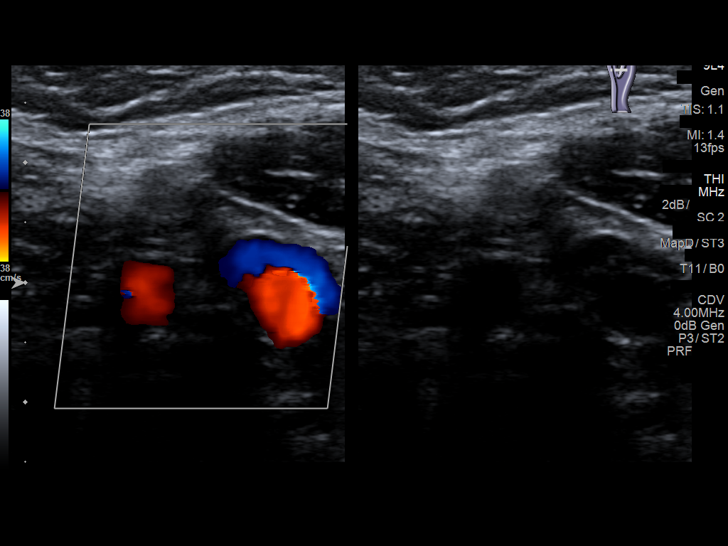
[im 51/62]
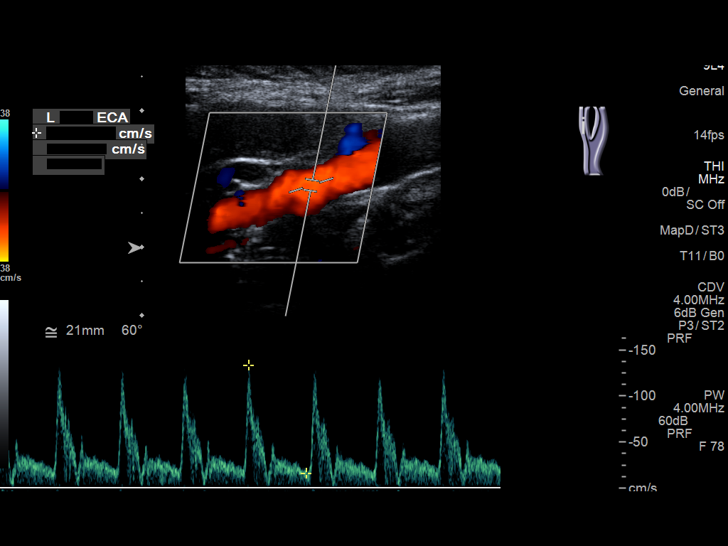
[im 56/62]
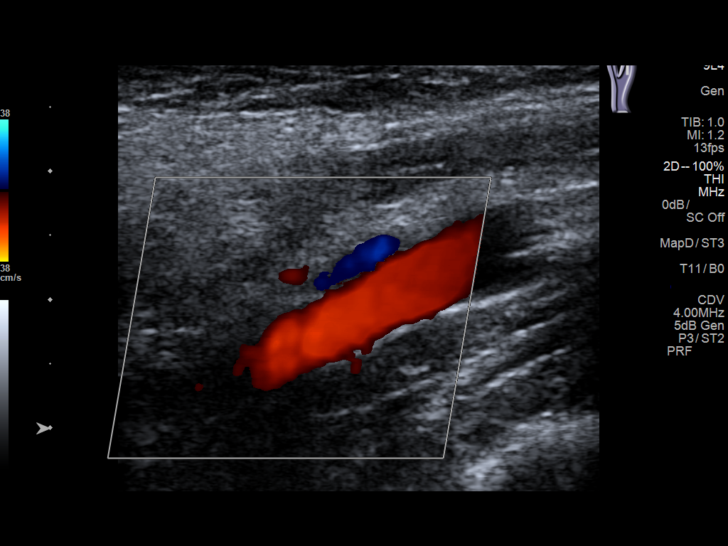
[im 62/62]
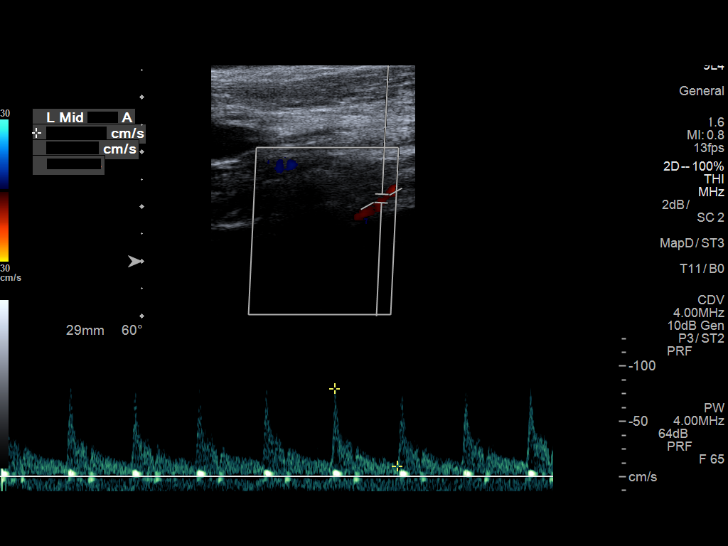

[14 of 24 positions shown; findings below may reference images not displayed]

FINDINGS: Criteria: Quantification of carotid stenosis is based on velocity
parameters that correlate the residual internal carotid diameter
with NASCET-based stenosis levels, using the diameter of the distal
internal carotid lumen as the denominator for stenosis measurement.

The following velocity measurements were obtained:

RIGHT

ICA:  93 cm/sec

CCA:  151 cm/sec

SYSTOLIC ICA/CCA RATIO:

DIASTOLIC ICA/CCA RATIO:

ECA:  148 cm/sec

LEFT

ICA:  105 cm/sec

CCA:  121 cm/sec

SYSTOLIC ICA/CCA RATIO:

DIASTOLIC ICA/CCA RATIO:

ECA:  133 cm/sec

RIGHT CAROTID ARTERY: Little if any plaque in the bulb. Low
resistance internal carotid Doppler pattern.

RIGHT VERTEBRAL ARTERY:  Antegrade.

LEFT CAROTID ARTERY: Little if any plaque in the bulb. Low
resistance internal carotid Doppler pattern.

LEFT VERTEBRAL ARTERY:  Antegrade.
IMPRESSION: Less than 50% stenosis in the right and left internal carotid
arteries.

## 2019-04-17 MED FILL — PANTOPRAZOLE SOD DR 40 MG T: 40 | 30 days supply | Qty: 30 | Fill #0

## 2019-04-20 MED FILL — LEVOCETIRIZINE 5 MG TABLET: 5 | 30 days supply | Qty: 30 | Fill #0

## 2019-04-20 MED FILL — buPROPion HCL ER (XL) 150 M: 150 | 30 days supply | Qty: 30 | Fill #0

## 2019-04-20 MED FILL — MONTELUKAST SOD 10 MG TAB: 10 | 30 days supply | Qty: 30 | Fill #0

## 2019-05-22 MED FILL — LEVOCETIRIZINE 5 MG TABLET: 5 | 30 days supply | Qty: 30 | Fill #1

## 2019-05-22 MED FILL — PANTOPRAZOLE SOD DR 40 MG T: 40 | 30 days supply | Qty: 30 | Fill #1

## 2019-05-22 MED FILL — buPROPion HCL ER (XL) 150 M: 150 | 30 days supply | Qty: 30 | Fill #1

## 2019-05-22 MED FILL — MONTELUKAST SOD 10 MG TAB: 10 | 30 days supply | Qty: 30 | Fill #1

## 2019-06-07 MED FILL — ALLOPURINOL 300 MG TABS: 300 | 30 days supply | Qty: 30 | Fill #0

## 2019-06-22 MED FILL — LEVOCETIRIZINE 5 MG TABLET: 5 | 30 days supply | Qty: 30 | Fill #2

## 2019-06-22 MED FILL — MONTELUKAST SOD 10 MG TAB: 10 | 30 days supply | Qty: 30 | Fill #2

## 2019-06-26 MED FILL — traZODone HCL 50 MG TABS: 50 | 30 days supply | Qty: 30 | Fill #0

## 2019-07-03 MED FILL — BUPROPION HCL XL 150 MG TAB: 150 | 30 days supply | Qty: 30 | Fill #2

## 2019-07-07 MED FILL — ALLOPURINOL 300 MG TABS: 300 | 30 days supply | Qty: 30 | Fill #1

## 2019-07-17 MED FILL — PANTOPRAZOLE SOD DR 40 MG T: 40 | 30 days supply | Qty: 30 | Fill #0

## 2019-07-24 MED FILL — traZODone HCL 50 MG TABS: 50 | 30 days supply | Qty: 30 | Fill #1

## 2019-07-24 MED FILL — LEVOCETIRIZINE 5 MG TABLET: 5 | 30 days supply | Qty: 30 | Fill #3

## 2019-07-24 MED FILL — LOSARTAN-HCTZ 100-12.5 MG T: 100-12.5 | 30 days supply | Qty: 30 | Fill #0

## 2019-07-24 MED FILL — MONTELUKAST SOD 10 MG TAB: 10 | 30 days supply | Qty: 30 | Fill #3

## 2019-07-27 MED FILL — VIBERZI 75 MG TABLET: 75 | 30 days supply | Qty: 30 | Fill #0

## 2019-08-09 MED FILL — ALLOPURINOL 300 MG TABS: 300 | 30 days supply | Qty: 30 | Fill #2

## 2019-08-09 MED FILL — buPROPion HCL ER (XL) 150 M: 150 | 30 days supply | Qty: 30 | Fill #3

## 2019-08-17 MED FILL — PANTOPRAZOLE SOD DR 40 MG T: 40 | 30 days supply | Qty: 30 | Fill #1

## 2019-08-24 MED FILL — LEVOCETIRIZINE 5 MG TABLET: 5 | 30 days supply | Qty: 30 | Fill #4

## 2019-08-24 MED FILL — traZODone HCL 50 MG TABS: 50 | 30 days supply | Qty: 30 | Fill #2

## 2019-08-24 MED FILL — MONTELUKAST SOD 10 MG TAB: 10 | 30 days supply | Qty: 30 | Fill #4

## 2019-08-24 MED FILL — LOSARTAN-HCTZ 100-12.5 MG T: 100-12.5 | 30 days supply | Qty: 30 | Fill #1

## 2019-09-06 ENCOUNTER — Other Ambulatory Visit (HOSPITAL_COMMUNITY): Payer: Self-pay | Admitting: Internal Medicine

## 2019-09-18 MED FILL — MONTELUKAST SOD 10 MG TAB: 10 | 30 days supply | Qty: 30 | Fill #0

## 2019-09-18 MED FILL — traZODone HCL 50 MG TABS: 50 | 30 days supply | Qty: 30 | Fill #3

## 2019-09-18 MED FILL — LOSARTAN-HCTZ 100-12.5 MG T: 100-12.5 | 30 days supply | Qty: 30 | Fill #2

## 2019-09-18 MED FILL — LEVOCETIRIZINE 5 MG TABLET: 5 | 30 days supply | Qty: 30 | Fill #0

## 2019-09-25 MED FILL — BREO ELLIPTA 100-25 MCG INH: 100-25 | 30 days supply | Qty: 60 | Fill #0

## 2019-10-09 MED FILL — buPROPion HCL ER (XL) 150 M: 150 | 30 days supply | Qty: 30 | Fill #0

## 2019-10-09 MED FILL — PANTOPRAZOLE SOD DR 40 MG T: 40 | 30 days supply | Qty: 30 | Fill #0

## 2019-10-09 MED FILL — ALLOPURINOL 300 MG TABS: 300 | 30 days supply | Qty: 30 | Fill #0

## 2019-10-18 MED FILL — traZODone HCL 50 MG TABS: 50 | 30 days supply | Qty: 30 | Fill #4

## 2019-10-18 MED FILL — MONTELUKAST SOD 10 MG TAB: 10 | 30 days supply | Qty: 30 | Fill #1

## 2019-10-18 MED FILL — LOSARTAN-HCTZ 100-12.5 MG T: 100-12.5 | 30 days supply | Qty: 30 | Fill #3

## 2019-10-18 MED FILL — LEVOCETIRIZINE 5 MG TABLET: 5 | 30 days supply | Qty: 30 | Fill #1

## 2019-10-19 MED FILL — VIBERZI 100 MG TABS: 100 | 30 days supply | Qty: 60 | Fill #0

## 2019-11-02 MED FILL — PANTOPRAZOLE SOD DR 40 MG T: 40 | 30 days supply | Qty: 30 | Fill #1

## 2019-11-02 MED FILL — buPROPion HCL ER (XL) 150 M: 150 | 30 days supply | Qty: 30 | Fill #1

## 2019-11-02 MED FILL — ALLOPURINOL 300 MG TABS: 300 | 30 days supply | Qty: 30 | Fill #1

## 2019-11-02 MED FILL — BREO ELLIPTA 100-25 MCG INH: 100-25 | 30 days supply | Qty: 60 | Fill #1

## 2019-11-16 MED FILL — MONTELUKAST SOD 10 MG TAB: 10 | 30 days supply | Qty: 30 | Fill #2

## 2019-11-16 MED FILL — LEVOCETIRIZINE 5 MG TABLET: 5 | 30 days supply | Qty: 30 | Fill #2

## 2019-11-16 MED FILL — traZODone HCL 50 MG TABS: 50 | 30 days supply | Qty: 30 | Fill #5

## 2019-11-16 MED FILL — LOSARTAN-HCTZ 100-12.5 MG T: 100-12.5 | 30 days supply | Qty: 30 | Fill #4

## 2019-11-27 ENCOUNTER — Other Ambulatory Visit (HOSPITAL_COMMUNITY): Payer: Self-pay | Admitting: Gastroenterology

## 2019-11-28 ENCOUNTER — Other Ambulatory Visit (HOSPITAL_COMMUNITY): Payer: Self-pay | Admitting: Allergy and Immunology

## 2019-11-28 MED FILL — BREO ELLIPTA 100-25 MCG INH: 100-25 | 30 days supply | Qty: 60 | Fill #0

## 2019-12-04 MED FILL — ALLOPURINOL 300 MG TABS: 300 | 30 days supply | Qty: 30 | Fill #2

## 2019-12-04 MED FILL — buPROPion HCL ER (XL) 150 M: 150 | 30 days supply | Qty: 30 | Fill #2

## 2019-12-04 MED FILL — PANTOPRAZOLE SOD DR 40 MG T: 40 | 30 days supply | Qty: 30 | Fill #2

## 2019-12-18 MED FILL — traZODone HCL 50 MG TABS: 50 | 30 days supply | Qty: 30 | Fill #6

## 2019-12-18 MED FILL — LEVOCETIRIZINE 5 MG TABLET: 5 | 30 days supply | Qty: 30 | Fill #3

## 2019-12-18 MED FILL — MONTELUKAST SOD 10 MG TAB: 10 | 30 days supply | Qty: 30 | Fill #3

## 2019-12-18 MED FILL — LOSARTAN-HCTZ 100-12.5 MG T: 100-12.5 | 30 days supply | Qty: 30 | Fill #5

## 2019-12-18 MED FILL — VIBERZI 100 MG TABS: 100 | 30 days supply | Qty: 60 | Fill #1

## 2020-01-03 MED FILL — buPROPion HCL ER (XL) 150 M: 150 | 30 days supply | Qty: 30 | Fill #3

## 2020-01-03 MED FILL — PANTOPRAZOLE SOD DR 40 MG T: 40 | 30 days supply | Qty: 30 | Fill #3

## 2020-01-03 MED FILL — BREO ELLIPTA 100-25 MCG INH: 100-25 | 30 days supply | Qty: 60 | Fill #1

## 2020-01-03 MED FILL — ALLOPURINOL 300 MG TABS: 300 | 30 days supply | Qty: 30 | Fill #3

## 2020-01-12 MED FILL — MONTELUKAST SOD 10 MG TAB: 10 | 30 days supply | Qty: 30 | Fill #4

## 2020-01-12 MED FILL — traZODone HCL 50 MG TABS: 50 | 30 days supply | Qty: 30 | Fill #7

## 2020-01-12 MED FILL — LEVOCETIRIZINE 5 MG TABLET: 5 | 30 days supply | Qty: 30 | Fill #4

## 2020-01-12 MED FILL — LOSARTAN-HCTZ 100-12.5 MG T: 100-12.5 | 30 days supply | Qty: 30 | Fill #6

## 2020-02-05 MED FILL — ALLOPURINOL 300 MG TABS: 300 | 30 days supply | Qty: 30 | Fill #4

## 2020-02-05 MED FILL — PANTOPRAZOLE SOD DR 40 MG T: 40 | 30 days supply | Qty: 30 | Fill #4

## 2020-02-05 MED FILL — buPROPion HCL ER (XL) 150 M: 150 | 30 days supply | Qty: 30 | Fill #4

## 2020-02-05 MED FILL — VIBERZI 100 MG TABS: 100 | 30 days supply | Qty: 60 | Fill #2

## 2020-02-12 MED FILL — MONTELUKAST SOD 10 MG TAB: 10 | 30 days supply | Qty: 30 | Fill #5

## 2020-02-12 MED FILL — LEVOCETIRIZINE 5 MG TABLET: 5 | 30 days supply | Qty: 30 | Fill #0

## 2020-02-12 MED FILL — traZODone HCL 50 MG TABS: 50 | 30 days supply | Qty: 30 | Fill #8

## 2020-02-12 MED FILL — LOSARTAN-HCTZ 100-12.5 MG T: 100-12.5 | 30 days supply | Qty: 30 | Fill #7

## 2020-02-26 MED FILL — AZELASTINE HCL 0.05 % SOLN: 0.05 | 30 days supply | Qty: 6 | Fill #0

## 2020-03-04 MED FILL — buPROPion HCL ER (XL) 150 M: 150 | 30 days supply | Qty: 30 | Fill #5

## 2020-03-04 MED FILL — PANTOPRAZOLE SOD DR 40 MG T: 40 | 30 days supply | Qty: 30 | Fill #5

## 2020-03-04 MED FILL — ALLOPURINOL 300 MG TABS: 300 | 30 days supply | Qty: 30 | Fill #5

## 2020-03-11 MED FILL — traZODone HCL 50 MG TABS: 50 | 30 days supply | Qty: 30 | Fill #9

## 2020-03-11 MED FILL — LEVOCETIRIZINE 5 MG TABLET: 5 | 30 days supply | Qty: 30 | Fill #1

## 2020-03-11 MED FILL — LOSARTAN-HCTZ 100-12.5 MG T: 100-12.5 | 30 days supply | Qty: 30 | Fill #8

## 2020-03-11 MED FILL — MONTELUKAST SOD 10 MG TAB: 10 | 30 days supply | Qty: 30 | Fill #6

## 2020-03-23 MED FILL — VIBERZI 100 MG TABS: 100 | 30 days supply | Qty: 60 | Fill #0

## 2020-04-01 MED FILL — PANTOPRAZOLE SOD DR 40 MG T: 40 | 30 days supply | Qty: 30 | Fill #6

## 2020-04-01 MED FILL — ALLOPURINOL 300 MG TABS: 300 | 30 days supply | Qty: 30 | Fill #6

## 2020-04-01 MED FILL — buPROPion HCL ER (XL) 150 M: 150 | 30 days supply | Qty: 30 | Fill #6

## 2020-04-09 MED FILL — LEVOCETIRIZINE 5 MG TABLET: 5 | 30 days supply | Qty: 30 | Fill #2

## 2020-04-09 MED FILL — LOSARTAN-HCTZ 100-12.5 MG T: 100-12.5 | 30 days supply | Qty: 30 | Fill #0

## 2020-04-09 MED FILL — MONTELUKAST SOD 10 MG TAB: 10 | 30 days supply | Qty: 30 | Fill #7

## 2020-04-09 MED FILL — traZODone HCL 50 MG TABS: 50 | 30 days supply | Qty: 30 | Fill #10

## 2020-04-15 MED FILL — AZELASTINE HCL 0.05% DROPS: 0.05 | 30 days supply | Qty: 6 | Fill #1

## 2020-04-16 ENCOUNTER — Other Ambulatory Visit (HOSPITAL_COMMUNITY): Payer: Self-pay

## 2020-04-19 MED FILL — BUTALB-ACETAMIN-CAFF 50-325: 50-325-40 | 3 days supply | Qty: 20 | Fill #0

## 2020-04-19 MED FILL — HYDROCODON-APAP 5-325: 5-325 | 2 days supply | Qty: 10 | Fill #0

## 2020-05-01 MED FILL — PANTOPRAZOLE SOD DR 40 MG T: 40 | 30 days supply | Qty: 30 | Fill #7

## 2020-05-01 MED FILL — ALLOPURINOL 300 MG TABS: 300 | 30 days supply | Qty: 30 | Fill #7

## 2020-05-01 MED FILL — buPROPion HCL ER (XL) 150 M: 150 | 30 days supply | Qty: 30 | Fill #7

## 2020-05-20 ENCOUNTER — Other Ambulatory Visit (HOSPITAL_COMMUNITY): Payer: Self-pay | Admitting: Internal Medicine

## 2020-05-20 MED FILL — VIBERZI 100 MG TABS: 100 | 30 days supply | Qty: 60 | Fill #1

## 2020-05-20 MED FILL — traZODone HCL 50 MG TABS: 50 | 90 days supply | Qty: 90 | Fill #0

## 2020-05-20 MED FILL — LOSARTAN-HCTZ 100-12.5 MG T: 100-12.5 | 30 days supply | Qty: 30 | Fill #1

## 2020-05-20 MED FILL — LEVOCETIRIZINE 5 MG TABLET: 5 | 30 days supply | Qty: 30 | Fill #3

## 2020-05-20 MED FILL — MONTELUKAST SOD 10 MG TAB: 10 | 30 days supply | Qty: 30 | Fill #8

## 2020-05-31 ENCOUNTER — Other Ambulatory Visit (HOSPITAL_COMMUNITY): Payer: Self-pay | Admitting: Internal Medicine

## 2020-06-03 MED FILL — ALLOPURINOL 300 MG TABS: 300 | 30 days supply | Qty: 30 | Fill #8

## 2020-06-03 MED FILL — buPROPion HCL ER (XL) 150 M: 150 | 30 days supply | Qty: 30 | Fill #8

## 2020-06-03 MED FILL — PANTOPRAZOLE SOD DR 40 MG T: 40 | 30 days supply | Qty: 30 | Fill #8

## 2020-06-17 MED FILL — DICYCLOMINE 10 MG CAPSULE: 10 | 30 days supply | Qty: 60 | Fill #0

## 2020-06-21 MED FILL — MONTELUKAST SOD 10 MG TAB: 10 | 30 days supply | Qty: 30 | Fill #9

## 2020-06-21 MED FILL — LEVOCETIRIZINE 5 MG TABLET: 5 | 30 days supply | Qty: 30 | Fill #4

## 2020-06-21 MED FILL — LOSARTAN-HCTZ 100-12.5 MG T: 100-12.5 | 30 days supply | Qty: 30 | Fill #2

## 2020-07-02 MED FILL — SUMATRIPTAN SUCCINATE 50 MG: 50 | 30 days supply | Qty: 9 | Fill #0

## 2020-07-02 MED FILL — buPROPion HCL ER (XL) 150 M: 150 | 30 days supply | Qty: 30 | Fill #9

## 2020-07-02 MED FILL — PANTOPRAZOLE SOD DR 40 MG T: 40 | 30 days supply | Qty: 30 | Fill #9

## 2020-07-02 MED FILL — ALLOPURINOL 300 MG TABS: 300 | 30 days supply | Qty: 30 | Fill #9

## 2020-07-15 MED FILL — BUTALB-ACETAMIN-CAFF 50-325: 50-325-40 | 3 days supply | Qty: 20 | Fill #1

## 2020-07-16 MED FILL — VIBERZI 100 MG TABS: 100 | 30 days supply | Qty: 60 | Fill #2

## 2020-07-19 MED FILL — MONTELUKAST SOD 10 MG TAB: 10 | 30 days supply | Qty: 30 | Fill #10

## 2020-07-19 MED FILL — LOSARTAN-HCTZ 100-12.5 MG T: 100-12.5 | 30 days supply | Qty: 30 | Fill #3

## 2020-07-19 MED FILL — LEVOCETIRIZINE 5 MG TABLET: 5 | 30 days supply | Qty: 30 | Fill #5

## 2020-08-02 MED FILL — ALLOPURINOL 300 MG TABS: 300 | 30 days supply | Qty: 30 | Fill #10

## 2020-08-02 MED FILL — PANTOPRAZOLE SOD DR 40 MG T: 40 | 30 days supply | Qty: 30 | Fill #10

## 2020-08-02 MED FILL — AZELASTINE HCL 0.05% DROPS: 0.05 | 30 days supply | Qty: 6 | Fill #2

## 2020-08-02 MED FILL — buPROPion HCL ER (XL) 150 M: 150 | 30 days supply | Qty: 30 | Fill #10

## 2020-08-05 ENCOUNTER — Other Ambulatory Visit: Payer: Self-pay | Admitting: Internal Medicine

## 2020-08-20 MED FILL — SUMATRIPTAN SUCCINATE 50 MG: 50 | 6 days supply | Qty: 2 | Fill #1

## 2020-08-20 MED FILL — MONTELUKAST SOD 10 MG TAB: 10 | 30 days supply | Qty: 30 | Fill #11

## 2020-08-20 MED FILL — LOSARTAN-HCTZ 100-12.5 MG T: 100-12.5 | 30 days supply | Qty: 30 | Fill #4

## 2020-08-20 MED FILL — traZODone HCL 50 MG TABS: 50 | 90 days supply | Qty: 90 | Fill #1

## 2020-08-26 ENCOUNTER — Other Ambulatory Visit (HOSPITAL_COMMUNITY): Payer: Self-pay | Admitting: Internal Medicine

## 2020-08-26 MED FILL — ALLOPURINOL 300 MG TABS: 300 | 30 days supply | Qty: 30 | Fill #0

## 2020-08-26 MED FILL — LEVOCETIRIZINE 5 MG TABLET: 5 | 30 days supply | Qty: 30 | Fill #0

## 2020-08-26 MED FILL — buPROPion HCL ER (XL) 150 M: 150 | 30 days supply | Qty: 30 | Fill #0

## 2020-08-26 MED FILL — PANTOPRAZOLE SOD DR 40 MG T: 40 | 30 days supply | Qty: 30 | Fill #0

## 2020-08-29 ENCOUNTER — Other Ambulatory Visit: Payer: Self-pay | Admitting: Gastroenterology

## 2020-09-10 ENCOUNTER — Other Ambulatory Visit: Payer: Self-pay

## 2020-09-17 ENCOUNTER — Other Ambulatory Visit: Payer: Self-pay | Admitting: Internal Medicine

## 2020-09-27 MED FILL — PANTOPRAZOLE SOD DR 40 MG T: 40 | 30 days supply | Qty: 30 | Fill #1

## 2020-09-27 MED FILL — buPROPion HCL ER (XL) 150 M: 150 | 30 days supply | Qty: 30 | Fill #1

## 2020-09-27 MED FILL — LEVOCETIRIZINE 5 MG TABLET: 5 | 30 days supply | Qty: 30 | Fill #1

## 2020-09-27 MED FILL — ALLOPURINOL 300 MG TABS: 300 | 30 days supply | Qty: 30 | Fill #1

## 2020-10-04 MED FILL — LOSARTAN-HCTZ 100-12.5 MG T: 100-12.5 | 30 days supply | Qty: 30 | Fill #0

## 2020-10-04 MED FILL — MONTELUKAST SOD 10 MG TAB: 10 | 30 days supply | Qty: 30 | Fill #0

## 2020-10-25 MED FILL — ALLOPURINOL 300 MG TABS: 300 | 30 days supply | Qty: 30 | Fill #2

## 2020-10-25 MED FILL — buPROPion HCL ER (XL) 150 M: 150 | 30 days supply | Qty: 30 | Fill #2

## 2020-10-25 MED FILL — PANTOPRAZOLE SOD DR 40 MG T: 40 | 30 days supply | Qty: 30 | Fill #2

## 2020-11-08 MED FILL — LOSARTAN-HCTZ 100-12.5 MG T: 100-12.5 | 30 days supply | Qty: 30 | Fill #1

## 2020-11-08 MED FILL — MONTELUKAST SOD 10 MG TAB: 10 | 30 days supply | Qty: 30 | Fill #1

## 2020-11-21 ENCOUNTER — Other Ambulatory Visit (HOSPITAL_COMMUNITY): Payer: Self-pay

## 2020-11-21 MED FILL — Allopurinol Tab 300 MG: ORAL | 30 days supply | Qty: 30 | Fill #0 | Status: CN

## 2020-11-21 MED FILL — Bupropion HCl Tab ER 24HR 150 MG: ORAL | 30 days supply | Qty: 30 | Fill #0 | Status: CN

## 2020-11-21 MED FILL — Pantoprazole Sodium EC Tab 40 MG (Base Equiv): ORAL | 30 days supply | Qty: 30 | Fill #0 | Status: CN

## 2020-12-02 ENCOUNTER — Other Ambulatory Visit (HOSPITAL_COMMUNITY): Payer: Self-pay

## 2020-12-02 MED FILL — Pantoprazole Sodium EC Tab 40 MG (Base Equiv): ORAL | 30 days supply | Qty: 30 | Fill #0 | Status: AC

## 2020-12-02 MED FILL — Allopurinol Tab 300 MG: ORAL | 30 days supply | Qty: 30 | Fill #0 | Status: AC

## 2020-12-02 MED FILL — Dicyclomine HCl Cap 10 MG: ORAL | 30 days supply | Qty: 60 | Fill #0 | Status: AC

## 2020-12-02 MED FILL — Bupropion HCl Tab ER 24HR 150 MG: ORAL | 30 days supply | Qty: 30 | Fill #0 | Status: AC

## 2020-12-02 MED FILL — Trazodone HCl Tab 50 MG: ORAL | 90 days supply | Qty: 90 | Fill #0 | Status: AC

## 2020-12-05 ENCOUNTER — Other Ambulatory Visit (HOSPITAL_COMMUNITY): Payer: Self-pay

## 2020-12-05 MED ORDER — BREO ELLIPTA 100-25 MCG/INH IN AEPB
INHALATION_SPRAY | RESPIRATORY_TRACT | 1 refills | Status: AC
  Filled 2020-12-05: qty 60, 30d supply, fill #0
  Filled 2021-01-07: qty 60, 30d supply, fill #1

## 2020-12-09 ENCOUNTER — Other Ambulatory Visit (HOSPITAL_COMMUNITY): Payer: Self-pay

## 2020-12-09 MED FILL — Losartan Potassium & Hydrochlorothiazide Tab 100-12.5 MG: ORAL | 90 days supply | Qty: 90 | Fill #0 | Status: AC

## 2020-12-10 ENCOUNTER — Other Ambulatory Visit (HOSPITAL_COMMUNITY): Payer: Self-pay

## 2020-12-10 MED ORDER — MONTELUKAST SODIUM 10 MG PO TABS
ORAL_TABLET | ORAL | 0 refills | Status: DC
  Filled 2020-12-10: qty 30, 30d supply, fill #0

## 2020-12-12 ENCOUNTER — Other Ambulatory Visit (HOSPITAL_COMMUNITY): Payer: Self-pay

## 2020-12-23 ENCOUNTER — Other Ambulatory Visit (HOSPITAL_COMMUNITY): Payer: Self-pay

## 2021-01-07 ENCOUNTER — Other Ambulatory Visit (HOSPITAL_COMMUNITY): Payer: Self-pay

## 2021-01-07 ENCOUNTER — Other Ambulatory Visit: Payer: Self-pay

## 2021-01-07 MED ORDER — MONTELUKAST SODIUM 10 MG PO TABS
ORAL_TABLET | ORAL | 0 refills | Status: DC
  Filled 2021-01-07: qty 30, 30d supply, fill #0

## 2021-01-07 MED ORDER — LEVOCETIRIZINE DIHYDROCHLORIDE 5 MG PO TABS
ORAL_TABLET | ORAL | 5 refills | Status: AC
  Filled 2021-01-07 – 2021-08-31 (×4): qty 30, 30d supply, fill #0

## 2021-01-07 MED ORDER — AZELASTINE HCL 0.05 % OP SOLN
OPHTHALMIC | 5 refills | Status: DC
  Filled 2021-01-07: qty 6, 15d supply, fill #0

## 2021-01-07 MED ORDER — MONTELUKAST SODIUM 10 MG PO TABS
ORAL_TABLET | ORAL | 5 refills | Status: DC
  Filled 2021-02-03: qty 30, 30d supply, fill #0
  Filled 2021-03-11: qty 30, 30d supply, fill #1

## 2021-01-07 MED ORDER — OLOPATADINE HCL 0.6 % NA SOLN
NASAL | 5 refills | Status: AC
  Filled 2021-01-07 – 2021-12-31 (×2): qty 30.5, 30d supply, fill #0

## 2021-01-07 MED ORDER — LEVALBUTEROL TARTRATE 45 MCG/ACT IN AERO
2.0000 | INHALATION_SPRAY | Freq: Four times a day (QID) | RESPIRATORY_TRACT | 0 refills | Status: AC | PRN
  Filled 2021-01-07: qty 15, 30d supply, fill #0
  Filled 2021-11-13: qty 15, 25d supply, fill #0
  Filled 2021-11-13: qty 15, 30d supply, fill #0
  Filled 2021-11-19: qty 15, 20d supply, fill #0

## 2021-01-07 MED FILL — Pantoprazole Sodium EC Tab 40 MG (Base Equiv): ORAL | 90 days supply | Qty: 90 | Fill #0 | Status: AC

## 2021-01-07 MED FILL — Bupropion HCl Tab ER 24HR 150 MG: ORAL | 90 days supply | Qty: 90 | Fill #0 | Status: AC

## 2021-01-07 MED FILL — Allopurinol Tab 300 MG: ORAL | 90 days supply | Qty: 90 | Fill #0 | Status: AC

## 2021-01-08 ENCOUNTER — Other Ambulatory Visit: Payer: Self-pay

## 2021-01-09 ENCOUNTER — Other Ambulatory Visit: Payer: Self-pay

## 2021-01-09 MED ORDER — ALBUTEROL SULFATE HFA 108 (90 BASE) MCG/ACT IN AERS
INHALATION_SPRAY | RESPIRATORY_TRACT | 0 refills | Status: AC
  Filled 2021-01-09: qty 8.5, 30d supply, fill #0
  Filled 2021-11-13: qty 8.5, 25d supply, fill #0
  Filled 2021-11-19: qty 8.5, 20d supply, fill #0

## 2021-01-17 ENCOUNTER — Other Ambulatory Visit (HOSPITAL_COMMUNITY): Payer: Self-pay

## 2021-01-17 MED ORDER — VIBERZI 75 MG PO TABS
ORAL_TABLET | ORAL | 3 refills | Status: DC
  Filled 2021-01-17: qty 180, 90d supply, fill #0
  Filled 2021-04-10 – 2021-04-19 (×2): qty 180, 90d supply, fill #1

## 2021-01-20 ENCOUNTER — Other Ambulatory Visit: Payer: Self-pay

## 2021-01-21 ENCOUNTER — Other Ambulatory Visit (HOSPITAL_COMMUNITY): Payer: Self-pay

## 2021-01-23 ENCOUNTER — Other Ambulatory Visit (HOSPITAL_COMMUNITY): Payer: Self-pay

## 2021-01-24 ENCOUNTER — Other Ambulatory Visit (HOSPITAL_COMMUNITY): Payer: Self-pay

## 2021-01-24 MED ORDER — PANTOPRAZOLE SODIUM 40 MG PO TBEC
DELAYED_RELEASE_TABLET | ORAL | 3 refills | Status: DC
  Filled 2021-01-24 – 2021-03-11 (×2): qty 180, 90d supply, fill #0
  Filled 2021-08-31: qty 180, 90d supply, fill #1

## 2021-01-28 ENCOUNTER — Other Ambulatory Visit: Payer: Self-pay

## 2021-02-03 ENCOUNTER — Other Ambulatory Visit (HOSPITAL_COMMUNITY): Payer: Self-pay

## 2021-02-13 ENCOUNTER — Other Ambulatory Visit: Payer: Self-pay

## 2021-02-13 MED ORDER — SUMATRIPTAN SUCCINATE 50 MG PO TABS
ORAL_TABLET | ORAL | 1 refills | Status: DC
  Filled 2021-02-13: qty 9, 15d supply, fill #0
  Filled 2021-04-12: qty 9, 15d supply, fill #1

## 2021-03-11 ENCOUNTER — Other Ambulatory Visit (HOSPITAL_COMMUNITY): Payer: Self-pay

## 2021-03-11 MED FILL — Losartan Potassium & Hydrochlorothiazide Tab 100-12.5 MG: ORAL | 90 days supply | Qty: 90 | Fill #1 | Status: AC

## 2021-03-31 ENCOUNTER — Other Ambulatory Visit (HOSPITAL_COMMUNITY): Payer: Self-pay

## 2021-04-10 ENCOUNTER — Other Ambulatory Visit (HOSPITAL_COMMUNITY): Payer: Self-pay

## 2021-04-10 MED ORDER — MONTELUKAST SODIUM 10 MG PO TABS
10.0000 mg | ORAL_TABLET | Freq: Every day | ORAL | 5 refills | Status: DC
  Filled 2021-04-10: qty 30, 30d supply, fill #0
  Filled 2021-05-14: qty 30, 30d supply, fill #1
  Filled 2021-06-09: qty 30, 30d supply, fill #2
  Filled 2021-07-14: qty 30, 30d supply, fill #3

## 2021-04-10 MED FILL — Butalbital-Acetaminophen-Caffeine Tab 50-325-40 MG: ORAL | 4 days supply | Qty: 20 | Fill #0 | Status: AC

## 2021-04-10 MED FILL — Allopurinol Tab 300 MG: ORAL | 90 days supply | Qty: 90 | Fill #1 | Status: AC

## 2021-04-11 ENCOUNTER — Other Ambulatory Visit (HOSPITAL_COMMUNITY): Payer: Self-pay

## 2021-04-12 ENCOUNTER — Other Ambulatory Visit (HOSPITAL_COMMUNITY): Payer: Self-pay

## 2021-04-12 MED FILL — Bupropion HCl Tab ER 24HR 150 MG: ORAL | 90 days supply | Qty: 90 | Fill #1 | Status: AC

## 2021-04-24 ENCOUNTER — Other Ambulatory Visit (HOSPITAL_COMMUNITY): Payer: Self-pay

## 2021-04-25 ENCOUNTER — Other Ambulatory Visit (HOSPITAL_COMMUNITY): Payer: Self-pay

## 2021-04-28 ENCOUNTER — Other Ambulatory Visit (HOSPITAL_COMMUNITY): Payer: Self-pay

## 2021-05-01 ENCOUNTER — Other Ambulatory Visit (HOSPITAL_COMMUNITY): Payer: Self-pay

## 2021-05-01 MED ORDER — HYDROCODONE-ACETAMINOPHEN 5-325 MG PO TABS
ORAL_TABLET | ORAL | 0 refills | Status: AC
  Filled 2021-05-01: qty 10, 3d supply, fill #0

## 2021-05-14 ENCOUNTER — Other Ambulatory Visit (HOSPITAL_COMMUNITY): Payer: Self-pay

## 2021-05-14 MED FILL — Losartan Potassium & Hydrochlorothiazide Tab 100-12.5 MG: ORAL | 90 days supply | Qty: 90 | Fill #2 | Status: CN

## 2021-05-19 ENCOUNTER — Other Ambulatory Visit: Payer: Self-pay

## 2021-05-19 MED ORDER — METHOCARBAMOL 500 MG PO TABS
ORAL_TABLET | ORAL | 2 refills | Status: AC
  Filled 2021-05-19: qty 60, 30d supply, fill #0
  Filled 2021-08-31: qty 60, 30d supply, fill #1
  Filled 2022-04-21: qty 60, 30d supply, fill #2

## 2021-06-09 ENCOUNTER — Other Ambulatory Visit (HOSPITAL_COMMUNITY): Payer: Self-pay

## 2021-06-09 MED FILL — Losartan Potassium & Hydrochlorothiazide Tab 100-12.5 MG: ORAL | 90 days supply | Qty: 90 | Fill #2 | Status: AC

## 2021-06-09 MED FILL — Sumatriptan Succinate Tab 50 MG: ORAL | Qty: 2 | Fill #0 | Status: CN

## 2021-06-10 ENCOUNTER — Other Ambulatory Visit (HOSPITAL_COMMUNITY): Payer: Self-pay

## 2021-06-13 ENCOUNTER — Other Ambulatory Visit (HOSPITAL_COMMUNITY): Payer: Self-pay

## 2021-06-16 ENCOUNTER — Other Ambulatory Visit (HOSPITAL_COMMUNITY): Payer: Self-pay

## 2021-06-16 MED ORDER — SUMATRIPTAN SUCCINATE 50 MG PO TABS
ORAL_TABLET | ORAL | 0 refills | Status: DC
  Filled 2021-06-16: qty 9, 30d supply, fill #0

## 2021-06-17 ENCOUNTER — Other Ambulatory Visit (HOSPITAL_COMMUNITY): Payer: Self-pay

## 2021-07-14 MED FILL — Allopurinol Tab 300 MG: ORAL | 90 days supply | Qty: 90 | Fill #2 | Status: AC

## 2021-07-14 MED FILL — Bupropion HCl Tab ER 24HR 150 MG: ORAL | 90 days supply | Qty: 90 | Fill #2 | Status: AC

## 2021-07-15 ENCOUNTER — Other Ambulatory Visit (HOSPITAL_COMMUNITY): Payer: Self-pay

## 2021-08-12 ENCOUNTER — Other Ambulatory Visit (HOSPITAL_COMMUNITY): Payer: Self-pay

## 2021-08-13 ENCOUNTER — Other Ambulatory Visit (HOSPITAL_COMMUNITY): Payer: Self-pay

## 2021-08-15 ENCOUNTER — Other Ambulatory Visit: Payer: Self-pay

## 2021-08-15 ENCOUNTER — Other Ambulatory Visit (HOSPITAL_COMMUNITY): Payer: Self-pay

## 2021-08-15 MED ORDER — VIBERZI 75 MG PO TABS
ORAL_TABLET | ORAL | 3 refills | Status: DC
  Filled 2021-08-15: qty 180, 90d supply, fill #0
  Filled 2021-08-18 – 2021-08-26 (×2): qty 60, 30d supply, fill #0

## 2021-08-18 ENCOUNTER — Other Ambulatory Visit: Payer: Self-pay

## 2021-08-22 ENCOUNTER — Other Ambulatory Visit: Payer: Self-pay

## 2021-08-22 MED ORDER — MONTELUKAST SODIUM 10 MG PO TABS
10.0000 mg | ORAL_TABLET | Freq: Every day | ORAL | 1 refills | Status: DC
  Filled 2021-08-22: qty 90, 90d supply, fill #0
  Filled 2021-11-13 – 2021-11-18 (×3): qty 90, 90d supply, fill #1

## 2021-08-26 ENCOUNTER — Other Ambulatory Visit: Payer: Self-pay

## 2021-08-27 ENCOUNTER — Other Ambulatory Visit: Payer: Self-pay

## 2021-08-28 ENCOUNTER — Other Ambulatory Visit: Payer: Self-pay

## 2021-09-01 ENCOUNTER — Other Ambulatory Visit (HOSPITAL_COMMUNITY): Payer: Self-pay

## 2021-09-02 ENCOUNTER — Other Ambulatory Visit (HOSPITAL_COMMUNITY): Payer: Self-pay

## 2021-09-02 MED ORDER — VIBERZI 75 MG PO TABS
ORAL_TABLET | ORAL | 3 refills | Status: DC
  Filled 2021-09-24 (×2): qty 60, 30d supply, fill #0
  Filled 2021-11-13 – 2021-11-18 (×2): qty 60, 30d supply, fill #1
  Filled 2021-12-12 – 2021-12-16 (×2): qty 60, 30d supply, fill #2
  Filled 2022-01-27: qty 60, 30d supply, fill #3

## 2021-09-02 MED ORDER — PANTOPRAZOLE SODIUM 40 MG PO TBEC
DELAYED_RELEASE_TABLET | ORAL | 3 refills | Status: DC
  Filled 2021-09-02: qty 180, 90d supply, fill #0

## 2021-09-11 ENCOUNTER — Other Ambulatory Visit (HOSPITAL_COMMUNITY): Payer: Self-pay

## 2021-09-12 ENCOUNTER — Other Ambulatory Visit (HOSPITAL_COMMUNITY): Payer: Self-pay

## 2021-09-12 MED ORDER — BUTALBITAL-APAP-CAFFEINE 50-325-40 MG PO TABS
ORAL_TABLET | ORAL | 2 refills | Status: AC
  Filled 2021-09-12: qty 20, 3d supply, fill #0
  Filled 2021-11-13 – 2021-11-18 (×2): qty 20, 3d supply, fill #1

## 2021-09-18 ENCOUNTER — Other Ambulatory Visit (HOSPITAL_COMMUNITY): Payer: Self-pay

## 2021-09-22 ENCOUNTER — Other Ambulatory Visit (HOSPITAL_COMMUNITY): Payer: Self-pay

## 2021-09-22 MED ORDER — PANTOPRAZOLE SODIUM 40 MG PO TBEC
DELAYED_RELEASE_TABLET | ORAL | 3 refills | Status: DC
  Filled 2021-09-22 – 2022-03-08 (×2): qty 90, 90d supply, fill #0
  Filled 2022-06-05: qty 90, 90d supply, fill #1
  Filled 2022-08-25: qty 90, 90d supply, fill #2

## 2021-09-22 MED ORDER — ALLOPURINOL 300 MG PO TABS
ORAL_TABLET | ORAL | 3 refills | Status: DC
  Filled 2021-09-22: qty 90, 90d supply, fill #0
  Filled 2022-02-03: qty 90, 90d supply, fill #1
  Filled 2022-03-08 – 2022-05-02 (×2): qty 90, 90d supply, fill #2
  Filled 2022-07-27: qty 90, 90d supply, fill #3

## 2021-09-22 MED ORDER — COLCHICINE 0.6 MG PO CAPS
ORAL_CAPSULE | ORAL | 3 refills | Status: AC
  Filled 2021-09-22 – 2022-05-14 (×2): qty 30, 30d supply, fill #0

## 2021-09-22 MED ORDER — BUPROPION HCL ER (XL) 150 MG PO TB24
150.0000 mg | ORAL_TABLET | Freq: Every morning | ORAL | 3 refills | Status: AC
  Filled 2021-09-22: qty 90, 90d supply, fill #0
  Filled 2022-05-14: qty 90, 90d supply, fill #1

## 2021-09-22 MED ORDER — LOSARTAN POTASSIUM-HCTZ 100-12.5 MG PO TABS
1.0000 | ORAL_TABLET | Freq: Every day | ORAL | 3 refills | Status: AC
  Filled 2021-09-22: qty 90, 90d supply, fill #0

## 2021-09-23 ENCOUNTER — Other Ambulatory Visit (HOSPITAL_COMMUNITY): Payer: Self-pay

## 2021-09-24 ENCOUNTER — Other Ambulatory Visit (HOSPITAL_COMMUNITY): Payer: Self-pay

## 2021-10-08 ENCOUNTER — Encounter: Payer: Self-pay | Admitting: Urology

## 2021-10-08 ENCOUNTER — Other Ambulatory Visit: Payer: Self-pay

## 2021-10-08 ENCOUNTER — Ambulatory Visit (INDEPENDENT_AMBULATORY_CARE_PROVIDER_SITE_OTHER): Payer: BC Managed Care – PPO | Admitting: Urology

## 2021-10-08 ENCOUNTER — Other Ambulatory Visit (HOSPITAL_COMMUNITY): Payer: Self-pay

## 2021-10-08 VITALS — BP 162/105 | HR 89 | Ht 70.0 in | Wt 226.0 lb

## 2021-10-08 DIAGNOSIS — Z3009 Encounter for other general counseling and advice on contraception: Secondary | ICD-10-CM | POA: Diagnosis not present

## 2021-10-08 MED ORDER — DIAZEPAM 10 MG PO TABS
ORAL_TABLET | ORAL | 0 refills | Status: AC
  Filled 2021-10-08: qty 1, 1d supply, fill #0

## 2021-10-08 NOTE — Progress Notes (Signed)
10/08/2021 4:07 PM   Dean Ellis 11-17-1980 [id number]  Referring provider: Danella Penton, MD [phone] [facility] [address] [facility] [place],  [place] [address]  Chief Complaint  Patient presents with   VAS Consult    HPI: Dean Ellis is a 41 y.o. male who presents for vasectomy counseling.  Married with 2 children Prior history of stones not requiring surgical therapy.  No chronic scrotal pain, epididymitis or orchitis No previous history inguinal hernia or pelvic surgery No history of bleeding or clotting disorders   PMH: Past Medical History:  Diagnosis Date   Asthma    Gout    Seasonal allergies     Surgical History: Past Surgical History:  Procedure Laterality Date   MOUTH SURGERY      Home Medications:  Allergies as of 10/08/2021       Reactions   Gluten Meal Anaphylaxis   Nsaids Anaphylaxis   Aspirin Swelling        Medication List        Accurate as of October 08, 2021  4:07 PM. If you have any questions, ask your nurse or doctor.          STOP taking these medications    traZODone 50 MG tablet Commonly known as: DESYREL Stopped by: Riki Altes, MD       TAKE these medications    albuterol 108 (90 Base) MCG/ACT inhaler Commonly known as: VENTOLIN HFA Inhale 2 puffs into the lungs every 4-6 hours as needed for cough/wheeze (2 puffs Inhalation q 4-6 hours prn cough/wheeze 90 days)   allopurinol 300 MG tablet Commonly known as: ZYLOPRIM Take 1 tablet (300 mg total) by mouth once daily What changed: Another medication with the same name was removed. Continue taking this medication, and follow the directions you see here. Changed by: Riki Altes, MD   azelastine 0.05 % ophthalmic solution Commonly known as: OPTIVAR Place 1 drop into both eyes 2 (two) times daily. What changed: Another medication with the same name was removed. Continue taking this medication, and follow the directions you see  here. Changed by: Riki Altes, MD   Breo Ellipta 100-25 MCG/ACT Aepb Generic drug: fluticasone furoate-vilanterol inhale 1 puff into the lungs once daily (inhale 1 puff into the lungs once daily)   buPROPion 150 MG 24 hr tablet Commonly known as: WELLBUTRIN XL Take 1 tablet (150 mg total) by mouth every morning What changed: Another medication with the same name was removed. Continue taking this medication, and follow the directions you see here. Changed by: Riki Altes, MD   butalbital-acetaminophen-caffeine [phone] MG tablet Commonly known as: FIORICET Take 1-2 tablets by mouth every 6 hours for pain What changed: Another medication with the same name was removed. Continue taking this medication, and follow the directions you see here. Changed by: Riki Altes, MD   Colchicine 0.6 MG Caps Take 1 tablet by mouth once daily as needed What changed: Another medication with the same name was removed. Continue taking this medication, and follow the directions you see here. Changed by: Riki Altes, MD   dicyclomine 10 MG capsule Commonly known as: BENTYL TAKE 1 CAPSULE BY MOUTH 2 TIMES DAILY AS NEEDED What changed: Another medication with the same name was removed. Continue taking this medication, and follow the directions you see here. Changed by: Riki Altes, MD   fexofenadine 180 MG tablet Commonly known as: ALLEGRA Take 180 mg by mouth daily.  HYDROcodone-acetaminophen 5-325 MG tablet Commonly known as: NORCO/VICODIN Take 1 tablet by mouth every 4 to 6 hours as needed. Do not exceed 6 tablets in 24 hour period What changed: Another medication with the same name was removed. Continue taking this medication, and follow the directions you see here. Changed by: Riki Altes, MD   levalbuterol 45 MCG/ACT inhaler Commonly known as: XOPENEX HFA Use 2 puffs every 6 hours as needed for cough or wheeze.   levocetirizine 5 MG tablet Commonly known as:  XYZAL Take 1 tablet by mouth daily What changed: Another medication with the same name was removed. Continue taking this medication, and follow the directions you see here. Changed by: Riki Altes, MD   losartan-hydrochlorothiazide 100-12.5 MG tablet Commonly known as: HYZAAR Take 1 tablet by mouth once daily What changed: Another medication with the same name was removed. Continue taking this medication, and follow the directions you see here. Changed by: Riki Altes, MD   methocarbamol 500 MG tablet Commonly known as: ROBAXIN Take 1 tablet (500 mg total) by mouth 2 (two) times daily as needed (muscle) for up to 30 days   mometasone 50 MCG/ACT nasal spray Commonly known as: NASONEX Place 2 sprays into the nose daily.   montelukast 10 MG tablet Commonly known as: SINGULAIR Take 10 mg by mouth at bedtime. What changed: Another medication with the same name was removed. Continue taking this medication, and follow the directions you see here. Changed by: Riki Altes, MD   montelukast 10 MG tablet Commonly known as: SINGULAIR Take 1 tablet (10 mg total) by mouth once daily What changed: Another medication with the same name was removed. Continue taking this medication, and follow the directions you see here. Changed by: Riki Altes, MD   Olopatadine HCl 0.6 % Soln INSTILL 2 SPRAYS IN EACH NOSTRIL TWICE A DAY.   pantoprazole 40 MG tablet Commonly known as: PROTONIX Take 1 tablet by mouth once daily What changed: Another medication with the same name was removed. Continue taking this medication, and follow the directions you see here. Changed by: Riki Altes, MD   SUMAtriptan 50 MG tablet Commonly known as: IMITREX Take 1 tablet by mouth as directed for migraine, may take 2nd dose after 2 hours if needed What changed: Another medication with the same name was removed. Continue taking this medication, and follow the directions you see here. Changed by: Riki Altes, MD   Viberzi 75 MG Tabs Generic drug: Eluxadoline Take 1 tablet by mouth 2 times a day What changed: Another medication with the same name was removed. Continue taking this medication, and follow the directions you see here. Changed by: Riki Altes, MD        Allergies:  Allergies  Allergen Reactions   Gluten Meal Anaphylaxis   Nsaids Anaphylaxis   Aspirin Swelling    Family History: Family History  Problem Relation Age of Onset   Hypertension Father     Social History:  reports that he has never smoked. He has never used smokeless tobacco. He reports that he does not drink alcohol and does not use drugs.   Physical Exam: BP (!) 162/105    Pulse 89    Ht 5\' 10"  (1.778 m)    Wt 226 lb (102.5 kg)    BMI 32.43 kg/m   Constitutional:  Alert and oriented, No acute distress. HEENT: [place] AT, moist mucus membranes.  Trachea midline, no masses. Cardiovascular: No clubbing,  cyanosis, or edema. Respiratory: Normal respiratory effort, no increased work of breathing. GI: Abdomen is soft, nontender, nondistended, no abdominal masses GU: Phallus without lesions, testes descended bilaterally without masses or tenderness, spermatic cord/epididymis palpably normal bilaterally.  Thick cords bilaterally but vasa palpable Skin: No rashes, bruises or suspicious lesions. Neurologic: Grossly intact, no focal deficits, moving all 4 extremities. Psychiatric: Normal mood and affect.   Assessment & Plan:    1.  Undesired fertility Desires to schedule vasectomy We had a long discussion about vasectomy. We specifically discussed the procedure, recovery and the risks, benefits and alternatives of vasectomy. I explained that the procedure entails removal of a segment of each vas deferens, each of which conducts sperm, and that the purpose of this procedure is to cause sterility (inability to produce children or cause pregnancy). Vasectomy is intended to be permanent and irreversible form of  contraception. Options for fertility after vasectomy include vasectomy reversal, or sperm retrieval with in vitro fertilization. These options are not always successful, and they may be expensive. We discussed reversible forms of birth control such as condoms, IUD or diaphragms, as well as the option of freezing sperm in a sperm bank prior to the vasectomy procedure. We discussed the importance of avoiding strenuous exercise for four days after vasectomy, and the importance of refraining from any form of ejaculation for seven days after vasectomy. I explained that vasectomy does not produce immediate sterility so another form of contraceptive must be used until sterility is assured by having semen checked for sperm. Thus, a post vasectomy semen analysis is necessary to confirm sterility. Rarely, vasectomy must be repeated. We discussed the approximately 1 in 2,000 risk of pregnancy after vasectomy for men who have post-vasectomy semen analysis showing absent sperm or rare non-motile sperm. Typical side effects include a small amount of oozing blood, some discomfort and mild swelling in the area of incision.  Vasectomy does not affect sexual performance, function, please, sensation, interest, desire, satisfaction, penile erection, volume of semen or ejaculation. Other rare risks include allergy or adverse reaction to an anesthetic, testicular atrophy, hematoma, infection/abscess, prolonged tenderness of the vas deferens, pain, swelling, painful nodule or scar (called sperm granuloma) or epididymtis. We discussed chronic testicular pain syndrome. This has been reported to occur in as many as 1-2% of men and may be permanent. This can be treated with medication, small procedures or (rarely) surgery. Valium 10 mg as a preprocedure anxiolytic sent to pharmacy and he was informed he would need a driver if utilizing this medication    Riki Altes, MD  [facility] [address],  [address] [place], [place] [address] [phone]

## 2021-10-08 NOTE — Patient Instructions (Signed)

## 2021-10-14 ENCOUNTER — Other Ambulatory Visit (HOSPITAL_COMMUNITY): Payer: Self-pay

## 2021-10-15 ENCOUNTER — Other Ambulatory Visit (HOSPITAL_COMMUNITY): Payer: Self-pay

## 2021-10-21 ENCOUNTER — Other Ambulatory Visit (HOSPITAL_COMMUNITY): Payer: Self-pay

## 2021-10-23 ENCOUNTER — Other Ambulatory Visit (HOSPITAL_COMMUNITY): Payer: Self-pay

## 2021-10-23 MED ORDER — SUMATRIPTAN SUCCINATE 50 MG PO TABS
ORAL_TABLET | ORAL | 0 refills | Status: AC
  Filled 2021-10-23: qty 15, 30d supply, fill #0

## 2021-10-31 ENCOUNTER — Encounter: Payer: BC Managed Care – PPO | Admitting: Urology

## 2021-11-13 ENCOUNTER — Other Ambulatory Visit: Payer: Self-pay

## 2021-11-18 ENCOUNTER — Other Ambulatory Visit (HOSPITAL_COMMUNITY): Payer: Self-pay

## 2021-11-19 ENCOUNTER — Other Ambulatory Visit (HOSPITAL_COMMUNITY): Payer: Self-pay

## 2021-12-12 ENCOUNTER — Other Ambulatory Visit (HOSPITAL_COMMUNITY): Payer: Self-pay

## 2021-12-16 ENCOUNTER — Other Ambulatory Visit (HOSPITAL_COMMUNITY): Payer: Self-pay

## 2021-12-25 ENCOUNTER — Other Ambulatory Visit (HOSPITAL_COMMUNITY): Payer: Self-pay

## 2021-12-26 ENCOUNTER — Encounter: Payer: BC Managed Care – PPO | Admitting: Urology

## 2021-12-26 ENCOUNTER — Other Ambulatory Visit (HOSPITAL_COMMUNITY): Payer: Self-pay

## 2021-12-31 ENCOUNTER — Other Ambulatory Visit (HOSPITAL_COMMUNITY): Payer: Self-pay

## 2021-12-31 MED ORDER — AZELASTINE HCL 0.05 % OP SOLN
OPHTHALMIC | 3 refills | Status: DC
  Filled 2021-12-31: qty 6, 30d supply, fill #0
  Filled 2022-04-21: qty 6, 30d supply, fill #1
  Filled 2022-05-14: qty 6, 30d supply, fill #2
  Filled 2022-06-27: qty 6, 30d supply, fill #3

## 2022-01-02 ENCOUNTER — Other Ambulatory Visit: Payer: Self-pay

## 2022-01-02 MED ORDER — HYDROCORTISONE 1 % EX CREA
TOPICAL_CREAM | CUTANEOUS | 1 refills | Status: AC
  Filled 2022-01-02: qty 28, 15d supply, fill #0

## 2022-01-20 ENCOUNTER — Other Ambulatory Visit: Payer: Self-pay

## 2022-01-20 ENCOUNTER — Other Ambulatory Visit (HOSPITAL_COMMUNITY): Payer: Self-pay

## 2022-01-20 MED ORDER — MONTELUKAST SODIUM 10 MG PO TABS
ORAL_TABLET | ORAL | 5 refills | Status: DC
  Filled 2022-03-08: qty 30, 30d supply, fill #0
  Filled 2022-04-21: qty 30, 30d supply, fill #1
  Filled 2022-05-25: qty 30, 30d supply, fill #2
  Filled 2022-06-27: qty 30, 30d supply, fill #3
  Filled 2022-07-27: qty 30, 30d supply, fill #4
  Filled 2022-08-25: qty 30, 30d supply, fill #5

## 2022-01-20 MED ORDER — OLOPATADINE HCL 0.6 % NA SOLN
2.0000 | Freq: Two times a day (BID) | NASAL | 5 refills | Status: AC
  Filled 2022-01-20: qty 30.5, 30d supply, fill #0
  Filled 2022-04-21: qty 30.5, 25d supply, fill #0
  Filled 2022-06-27 – 2022-07-01 (×2): qty 30.5, 30d supply, fill #0

## 2022-01-20 MED ORDER — FLUTICASONE FUROATE-VILANTEROL 100-25 MCG/ACT IN AEPB
INHALATION_SPRAY | RESPIRATORY_TRACT | 5 refills | Status: AC
  Filled 2022-01-20: qty 60, 30d supply, fill #0

## 2022-01-20 MED ORDER — LEVALBUTEROL TARTRATE 45 MCG/ACT IN AERO
INHALATION_SPRAY | RESPIRATORY_TRACT | 0 refills | Status: AC
  Filled 2022-01-20 (×2): qty 15, 30d supply, fill #0

## 2022-01-20 MED ORDER — LEVOCETIRIZINE DIHYDROCHLORIDE 5 MG PO TABS
ORAL_TABLET | ORAL | 5 refills | Status: DC
  Filled 2022-01-20 – 2022-04-21 (×2): qty 30, 30d supply, fill #0
  Filled 2022-05-25: qty 30, 30d supply, fill #1
  Filled 2022-06-17: qty 30, 30d supply, fill #2
  Filled 2022-07-27: qty 30, 30d supply, fill #3
  Filled 2022-09-07: qty 30, 30d supply, fill #4
  Filled 2022-12-03: qty 30, 30d supply, fill #5

## 2022-01-22 ENCOUNTER — Other Ambulatory Visit: Payer: Self-pay

## 2022-01-23 ENCOUNTER — Other Ambulatory Visit: Payer: Self-pay

## 2022-01-27 ENCOUNTER — Other Ambulatory Visit (HOSPITAL_COMMUNITY): Payer: Self-pay

## 2022-01-28 ENCOUNTER — Other Ambulatory Visit (HOSPITAL_COMMUNITY): Payer: Self-pay

## 2022-01-29 ENCOUNTER — Other Ambulatory Visit: Payer: Self-pay

## 2022-02-03 ENCOUNTER — Other Ambulatory Visit (HOSPITAL_COMMUNITY): Payer: Self-pay

## 2022-02-03 MED ORDER — DOXYCYCLINE HYCLATE 100 MG PO CAPS
100.0000 mg | ORAL_CAPSULE | Freq: Two times a day (BID) | ORAL | 0 refills | Status: AC
  Filled 2022-02-03: qty 14, 7d supply, fill #0

## 2022-02-03 MED ORDER — OLMESARTAN MEDOXOMIL-HCTZ 40-12.5 MG PO TABS
1.0000 | ORAL_TABLET | Freq: Every day | ORAL | 3 refills | Status: AC
  Filled 2022-02-03: qty 90, 90d supply, fill #0
  Filled 2022-05-02: qty 90, 90d supply, fill #1
  Filled 2022-07-27: qty 90, 90d supply, fill #2

## 2022-02-04 ENCOUNTER — Other Ambulatory Visit (HOSPITAL_COMMUNITY): Payer: Self-pay

## 2022-02-10 ENCOUNTER — Other Ambulatory Visit (HOSPITAL_COMMUNITY): Payer: Self-pay

## 2022-03-08 ENCOUNTER — Other Ambulatory Visit (HOSPITAL_COMMUNITY): Payer: Self-pay

## 2022-03-09 ENCOUNTER — Other Ambulatory Visit (HOSPITAL_COMMUNITY): Payer: Self-pay

## 2022-03-10 ENCOUNTER — Other Ambulatory Visit (HOSPITAL_COMMUNITY): Payer: Self-pay

## 2022-03-10 MED ORDER — VIBERZI 75 MG PO TABS
ORAL_TABLET | ORAL | 1 refills | Status: DC
  Filled 2022-03-10: qty 60, 30d supply, fill #0
  Filled 2022-03-25 – 2022-04-08 (×3): qty 60, 30d supply, fill #1
  Filled 2022-05-02: qty 60, 30d supply, fill #2
  Filled 2022-06-05: qty 60, 30d supply, fill #3
  Filled 2022-07-20: qty 60, 30d supply, fill #4
  Filled 2022-08-25: qty 60, 30d supply, fill #5

## 2022-03-11 ENCOUNTER — Other Ambulatory Visit (HOSPITAL_COMMUNITY): Payer: Self-pay

## 2022-03-25 ENCOUNTER — Other Ambulatory Visit (HOSPITAL_COMMUNITY): Payer: Self-pay

## 2022-03-27 ENCOUNTER — Other Ambulatory Visit (HOSPITAL_COMMUNITY): Payer: Self-pay

## 2022-04-06 ENCOUNTER — Other Ambulatory Visit (HOSPITAL_COMMUNITY): Payer: Self-pay

## 2022-04-07 ENCOUNTER — Other Ambulatory Visit (HOSPITAL_COMMUNITY): Payer: Self-pay

## 2022-04-08 ENCOUNTER — Other Ambulatory Visit (HOSPITAL_COMMUNITY): Payer: Self-pay

## 2022-04-21 ENCOUNTER — Other Ambulatory Visit (HOSPITAL_COMMUNITY): Payer: Self-pay | Admitting: Gastroenterology

## 2022-04-21 ENCOUNTER — Other Ambulatory Visit (HOSPITAL_COMMUNITY): Payer: Self-pay

## 2022-04-30 ENCOUNTER — Other Ambulatory Visit (HOSPITAL_COMMUNITY): Payer: Self-pay

## 2022-04-30 MED ORDER — DICYCLOMINE HCL 10 MG PO CAPS
ORAL_CAPSULE | ORAL | 2 refills | Status: AC
  Filled 2022-04-30: qty 60, 30d supply, fill #0

## 2022-05-01 ENCOUNTER — Other Ambulatory Visit (HOSPITAL_COMMUNITY): Payer: Self-pay

## 2022-05-03 ENCOUNTER — Other Ambulatory Visit (HOSPITAL_COMMUNITY): Payer: Self-pay

## 2022-05-04 ENCOUNTER — Other Ambulatory Visit (HOSPITAL_COMMUNITY): Payer: Self-pay

## 2022-05-05 ENCOUNTER — Other Ambulatory Visit (HOSPITAL_COMMUNITY): Payer: Self-pay

## 2022-05-08 ENCOUNTER — Other Ambulatory Visit (HOSPITAL_COMMUNITY): Payer: Self-pay

## 2022-05-09 ENCOUNTER — Other Ambulatory Visit (HOSPITAL_COMMUNITY): Payer: Self-pay

## 2022-05-14 ENCOUNTER — Other Ambulatory Visit (HOSPITAL_COMMUNITY): Payer: Self-pay

## 2022-05-22 ENCOUNTER — Other Ambulatory Visit (HOSPITAL_COMMUNITY): Payer: Self-pay

## 2022-05-22 MED ORDER — HYDROCHLOROTHIAZIDE 25 MG PO TABS
25.0000 mg | ORAL_TABLET | Freq: Every day | ORAL | 11 refills | Status: AC | PRN
  Filled 2022-05-22: qty 30, 30d supply, fill #0
  Filled 2022-05-25: qty 30, 30d supply, fill #1

## 2022-05-25 ENCOUNTER — Other Ambulatory Visit (HOSPITAL_COMMUNITY): Payer: Self-pay

## 2022-05-29 ENCOUNTER — Encounter: Payer: BC Managed Care – PPO | Admitting: Urology

## 2022-06-05 ENCOUNTER — Other Ambulatory Visit (HOSPITAL_COMMUNITY): Payer: Self-pay

## 2022-06-10 ENCOUNTER — Other Ambulatory Visit (HOSPITAL_COMMUNITY): Payer: Self-pay

## 2022-06-12 ENCOUNTER — Other Ambulatory Visit (HOSPITAL_COMMUNITY): Payer: Self-pay

## 2022-06-13 ENCOUNTER — Other Ambulatory Visit (HOSPITAL_COMMUNITY): Payer: Self-pay

## 2022-06-18 ENCOUNTER — Other Ambulatory Visit (HOSPITAL_COMMUNITY): Payer: Self-pay

## 2022-06-29 ENCOUNTER — Other Ambulatory Visit (HOSPITAL_COMMUNITY): Payer: Self-pay

## 2022-07-01 ENCOUNTER — Other Ambulatory Visit (HOSPITAL_COMMUNITY): Payer: Self-pay

## 2022-07-02 ENCOUNTER — Other Ambulatory Visit (HOSPITAL_COMMUNITY): Payer: Self-pay

## 2022-07-20 ENCOUNTER — Other Ambulatory Visit (HOSPITAL_COMMUNITY): Payer: Self-pay

## 2022-07-24 ENCOUNTER — Other Ambulatory Visit (HOSPITAL_COMMUNITY): Payer: Self-pay

## 2022-07-24 MED ORDER — AZITHROMYCIN 500 MG PO TABS
ORAL_TABLET | ORAL | 0 refills | Status: AC
  Filled 2022-07-24: qty 5, 5d supply, fill #0

## 2022-07-27 ENCOUNTER — Other Ambulatory Visit (HOSPITAL_COMMUNITY): Payer: Self-pay

## 2022-07-28 ENCOUNTER — Other Ambulatory Visit (HOSPITAL_COMMUNITY): Payer: Self-pay

## 2022-08-25 ENCOUNTER — Other Ambulatory Visit (HOSPITAL_COMMUNITY): Payer: Self-pay

## 2022-09-03 ENCOUNTER — Other Ambulatory Visit (HOSPITAL_COMMUNITY): Payer: Self-pay

## 2022-09-04 ENCOUNTER — Other Ambulatory Visit (HOSPITAL_COMMUNITY): Payer: Self-pay

## 2022-09-04 MED ORDER — VIBERZI 75 MG PO TABS
75.0000 mg | ORAL_TABLET | Freq: Two times a day (BID) | ORAL | 1 refills | Status: DC
  Filled 2022-10-06: qty 60, 30d supply, fill #0
  Filled 2022-12-03: qty 60, 30d supply, fill #1
  Filled 2023-01-17 – 2023-01-29 (×2): qty 60, 30d supply, fill #2

## 2022-09-07 ENCOUNTER — Other Ambulatory Visit (HOSPITAL_COMMUNITY): Payer: Self-pay

## 2022-09-25 ENCOUNTER — Other Ambulatory Visit (HOSPITAL_COMMUNITY): Payer: Self-pay

## 2022-09-25 ENCOUNTER — Other Ambulatory Visit: Payer: Self-pay

## 2022-09-25 MED ORDER — MONTELUKAST SODIUM 10 MG PO TABS
10.0000 mg | ORAL_TABLET | Freq: Every day | ORAL | 3 refills | Status: AC
  Filled 2022-09-25: qty 90, 90d supply, fill #0
  Filled 2022-12-16: qty 90, 90d supply, fill #1
  Filled 2023-03-29: qty 90, 90d supply, fill #2
  Filled 2023-08-24 – 2023-09-21 (×2): qty 90, 90d supply, fill #3

## 2022-09-25 MED ORDER — OLMESARTAN MEDOXOMIL-HCTZ 40-12.5 MG PO TABS
1.0000 | ORAL_TABLET | Freq: Every day | ORAL | 3 refills | Status: DC
  Filled 2022-09-25 – 2022-10-26 (×2): qty 90, 90d supply, fill #0
  Filled 2023-01-17: qty 90, 90d supply, fill #1
  Filled 2023-05-12: qty 90, 90d supply, fill #2
  Filled 2023-08-02 – 2023-08-24 (×2): qty 90, 90d supply, fill #3

## 2022-09-25 MED ORDER — ALLOPURINOL 300 MG PO TABS
300.0000 mg | ORAL_TABLET | Freq: Every day | ORAL | 3 refills | Status: DC
  Filled 2022-10-06 – 2022-10-26 (×2): qty 90, 90d supply, fill #0
  Filled 2023-01-17: qty 90, 90d supply, fill #1
  Filled 2023-05-12: qty 90, 90d supply, fill #2
  Filled 2023-08-02 – 2023-08-24 (×2): qty 90, 90d supply, fill #3

## 2022-09-25 MED ORDER — COLCHICINE 0.6 MG PO TABS
0.6000 mg | ORAL_TABLET | Freq: Every day | ORAL | 3 refills | Status: AC | PRN
  Filled 2022-09-25: qty 30, 30d supply, fill #0

## 2022-09-26 ENCOUNTER — Other Ambulatory Visit (HOSPITAL_COMMUNITY): Payer: Self-pay

## 2022-09-26 MED ORDER — COLCHICINE 0.6 MG PO TABS
0.6000 mg | ORAL_TABLET | Freq: Two times a day (BID) | ORAL | 5 refills | Status: AC | PRN
  Filled 2022-09-26: qty 60, 30d supply, fill #0

## 2022-09-28 ENCOUNTER — Other Ambulatory Visit: Payer: Self-pay

## 2022-10-01 ENCOUNTER — Other Ambulatory Visit: Payer: Self-pay

## 2022-10-06 ENCOUNTER — Other Ambulatory Visit: Payer: Self-pay

## 2022-10-06 ENCOUNTER — Other Ambulatory Visit (HOSPITAL_COMMUNITY): Payer: Self-pay

## 2022-10-13 ENCOUNTER — Other Ambulatory Visit: Payer: Self-pay

## 2022-10-24 ENCOUNTER — Other Ambulatory Visit (HOSPITAL_COMMUNITY): Payer: Self-pay

## 2022-10-26 ENCOUNTER — Other Ambulatory Visit (HOSPITAL_COMMUNITY): Payer: Self-pay

## 2022-10-26 ENCOUNTER — Other Ambulatory Visit: Payer: Self-pay

## 2022-10-27 ENCOUNTER — Other Ambulatory Visit: Payer: Self-pay

## 2022-10-27 ENCOUNTER — Other Ambulatory Visit (HOSPITAL_COMMUNITY): Payer: Self-pay

## 2022-10-27 MED ORDER — AZELASTINE HCL 0.05 % OP SOLN
1.0000 [drp] | Freq: Two times a day (BID) | OPHTHALMIC | 3 refills | Status: AC
  Filled 2022-10-27: qty 6, 30d supply, fill #0
  Filled 2022-12-03: qty 6, 30d supply, fill #1

## 2022-10-30 ENCOUNTER — Other Ambulatory Visit (HOSPITAL_COMMUNITY): Payer: Self-pay

## 2022-10-30 MED ORDER — PANTOPRAZOLE SODIUM 40 MG PO TBEC
40.0000 mg | DELAYED_RELEASE_TABLET | Freq: Every day | ORAL | 3 refills | Status: AC
  Filled 2022-10-30: qty 90, fill #0
  Filled 2022-12-03: qty 90, 90d supply, fill #0
  Filled 2023-03-09: qty 90, 90d supply, fill #1
  Filled 2023-07-05: qty 90, 90d supply, fill #2

## 2022-12-03 ENCOUNTER — Other Ambulatory Visit (HOSPITAL_COMMUNITY): Payer: Self-pay

## 2022-12-04 ENCOUNTER — Other Ambulatory Visit: Payer: Self-pay

## 2022-12-04 ENCOUNTER — Other Ambulatory Visit (HOSPITAL_COMMUNITY): Payer: Self-pay

## 2022-12-17 ENCOUNTER — Other Ambulatory Visit (HOSPITAL_COMMUNITY): Payer: Self-pay

## 2022-12-17 ENCOUNTER — Other Ambulatory Visit: Payer: Self-pay

## 2022-12-18 ENCOUNTER — Other Ambulatory Visit (HOSPITAL_COMMUNITY): Payer: Self-pay

## 2022-12-21 ENCOUNTER — Other Ambulatory Visit (HOSPITAL_COMMUNITY): Payer: Self-pay

## 2023-01-12 ENCOUNTER — Other Ambulatory Visit (HOSPITAL_COMMUNITY): Payer: Self-pay

## 2023-01-14 ENCOUNTER — Other Ambulatory Visit (HOSPITAL_COMMUNITY): Payer: Self-pay

## 2023-01-17 ENCOUNTER — Other Ambulatory Visit (HOSPITAL_COMMUNITY): Payer: Self-pay

## 2023-01-18 ENCOUNTER — Other Ambulatory Visit (HOSPITAL_COMMUNITY): Payer: Self-pay

## 2023-01-18 ENCOUNTER — Other Ambulatory Visit: Payer: Self-pay

## 2023-01-18 MED ORDER — LEVOCETIRIZINE DIHYDROCHLORIDE 5 MG PO TABS
5.0000 mg | ORAL_TABLET | Freq: Every day | ORAL | 0 refills | Status: DC
  Filled 2023-01-18: qty 30, 30d supply, fill #0

## 2023-01-19 ENCOUNTER — Other Ambulatory Visit (HOSPITAL_COMMUNITY): Payer: Self-pay

## 2023-01-22 ENCOUNTER — Other Ambulatory Visit (HOSPITAL_COMMUNITY): Payer: Self-pay

## 2023-01-22 ENCOUNTER — Other Ambulatory Visit: Payer: Self-pay

## 2023-01-22 MED ORDER — FLUTICASONE FUROATE-VILANTEROL 100-25 MCG/ACT IN AEPB
1.0000 | INHALATION_SPRAY | Freq: Every day | RESPIRATORY_TRACT | 5 refills | Status: AC
  Filled 2023-01-22: qty 60, 30d supply, fill #0
  Filled 2023-10-07: qty 60, 60d supply, fill #0

## 2023-01-22 MED ORDER — LEVALBUTEROL TARTRATE 45 MCG/ACT IN AERO
2.0000 | INHALATION_SPRAY | Freq: Four times a day (QID) | RESPIRATORY_TRACT | 0 refills | Status: AC | PRN
  Filled 2023-01-22 (×2): qty 15, 25d supply, fill #0

## 2023-01-22 MED ORDER — MONTELUKAST SODIUM 10 MG PO TABS
10.0000 mg | ORAL_TABLET | Freq: Every day | ORAL | 5 refills | Status: AC
  Filled 2023-01-22 – 2023-07-05 (×4): qty 30, 30d supply, fill #0
  Filled 2023-08-02: qty 30, 30d supply, fill #1
  Filled 2023-10-07: qty 30, 30d supply, fill #2

## 2023-01-22 MED ORDER — LEVOCETIRIZINE DIHYDROCHLORIDE 5 MG PO TABS
5.0000 mg | ORAL_TABLET | Freq: Every day | ORAL | 5 refills | Status: DC
  Filled 2023-01-22 – 2023-03-29 (×2): qty 30, 30d supply, fill #0
  Filled 2023-04-26: qty 30, 30d supply, fill #1
  Filled 2023-05-12 – 2023-05-23 (×2): qty 30, 30d supply, fill #2
  Filled 2023-07-05: qty 30, 30d supply, fill #3
  Filled 2023-08-02: qty 30, 30d supply, fill #4
  Filled 2023-08-24: qty 30, 30d supply, fill #5

## 2023-01-22 MED ORDER — OLOPATADINE HCL 0.6 % NA SOLN
2.0000 | Freq: Two times a day (BID) | NASAL | 5 refills | Status: AC
  Filled 2023-01-22 – 2023-05-12 (×3): qty 30.5, 30d supply, fill #0

## 2023-01-22 MED ORDER — AZELASTINE HCL 0.05 % OP SOLN
1.0000 [drp] | Freq: Two times a day (BID) | OPHTHALMIC | 5 refills | Status: DC
  Filled 2023-01-22 – 2023-01-29 (×2): qty 6, 30d supply, fill #0
  Filled 2023-05-23: qty 6, 30d supply, fill #1
  Filled 2023-10-07: qty 6, 30d supply, fill #2
  Filled 2023-12-26: qty 6, 30d supply, fill #3

## 2023-01-23 ENCOUNTER — Other Ambulatory Visit (HOSPITAL_COMMUNITY): Payer: Self-pay

## 2023-01-25 ENCOUNTER — Other Ambulatory Visit (HOSPITAL_COMMUNITY): Payer: Self-pay

## 2023-01-25 ENCOUNTER — Encounter: Payer: Self-pay | Admitting: Pharmacist

## 2023-01-25 ENCOUNTER — Other Ambulatory Visit: Payer: Self-pay

## 2023-01-25 MED ORDER — AZELASTINE HCL 137 MCG/SPRAY NA SOLN
1.0000 | Freq: Two times a day (BID) | NASAL | 5 refills | Status: AC
  Filled 2023-01-25 – 2023-01-29 (×2): qty 30, 30d supply, fill #0

## 2023-01-25 MED ORDER — ALBUTEROL SULFATE HFA 108 (90 BASE) MCG/ACT IN AERS
2.0000 | INHALATION_SPRAY | RESPIRATORY_TRACT | 0 refills | Status: AC
  Filled 2023-01-25 – 2023-01-29 (×2): qty 6.7, 25d supply, fill #0

## 2023-01-26 ENCOUNTER — Other Ambulatory Visit: Payer: Self-pay

## 2023-01-28 ENCOUNTER — Other Ambulatory Visit: Payer: Self-pay

## 2023-01-29 ENCOUNTER — Other Ambulatory Visit (HOSPITAL_COMMUNITY): Payer: Self-pay

## 2023-01-29 ENCOUNTER — Other Ambulatory Visit: Payer: Self-pay

## 2023-03-09 ENCOUNTER — Other Ambulatory Visit (HOSPITAL_COMMUNITY): Payer: Self-pay

## 2023-03-30 ENCOUNTER — Other Ambulatory Visit: Payer: Self-pay

## 2023-03-30 ENCOUNTER — Other Ambulatory Visit (HOSPITAL_COMMUNITY): Payer: Self-pay

## 2023-04-26 ENCOUNTER — Other Ambulatory Visit (HOSPITAL_COMMUNITY): Payer: Self-pay

## 2023-04-27 ENCOUNTER — Other Ambulatory Visit (HOSPITAL_COMMUNITY): Payer: Self-pay

## 2023-04-27 ENCOUNTER — Other Ambulatory Visit: Payer: Self-pay

## 2023-04-27 MED ORDER — VIBERZI 75 MG PO TABS
1.0000 | ORAL_TABLET | Freq: Two times a day (BID) | ORAL | 1 refills | Status: DC
  Filled 2023-04-27: qty 180, 90d supply, fill #0
  Filled 2023-08-02: qty 180, 90d supply, fill #1

## 2023-05-05 ENCOUNTER — Encounter (HOSPITAL_COMMUNITY): Payer: Self-pay

## 2023-05-05 ENCOUNTER — Other Ambulatory Visit (HOSPITAL_COMMUNITY): Payer: Self-pay

## 2023-05-06 ENCOUNTER — Other Ambulatory Visit: Payer: Self-pay

## 2023-05-12 ENCOUNTER — Other Ambulatory Visit (HOSPITAL_COMMUNITY): Payer: Self-pay

## 2023-05-12 ENCOUNTER — Other Ambulatory Visit: Payer: Self-pay

## 2023-05-23 ENCOUNTER — Other Ambulatory Visit (HOSPITAL_COMMUNITY): Payer: Self-pay

## 2023-06-24 ENCOUNTER — Other Ambulatory Visit: Payer: Self-pay

## 2023-07-05 ENCOUNTER — Other Ambulatory Visit (HOSPITAL_COMMUNITY): Payer: Self-pay

## 2023-07-05 ENCOUNTER — Other Ambulatory Visit: Payer: Self-pay

## 2023-07-05 ENCOUNTER — Encounter (HOSPITAL_COMMUNITY): Payer: Self-pay

## 2023-07-06 ENCOUNTER — Other Ambulatory Visit (HOSPITAL_COMMUNITY): Payer: Self-pay

## 2023-08-02 ENCOUNTER — Other Ambulatory Visit: Payer: Self-pay

## 2023-08-02 ENCOUNTER — Other Ambulatory Visit (HOSPITAL_COMMUNITY): Payer: Self-pay

## 2023-08-03 ENCOUNTER — Other Ambulatory Visit: Payer: Self-pay

## 2023-08-25 ENCOUNTER — Other Ambulatory Visit: Payer: Self-pay

## 2023-09-15 ENCOUNTER — Other Ambulatory Visit: Payer: Self-pay

## 2023-09-15 ENCOUNTER — Other Ambulatory Visit (HOSPITAL_COMMUNITY): Payer: Self-pay

## 2023-09-15 MED ORDER — PANTOPRAZOLE SODIUM 40 MG PO TBEC
40.0000 mg | DELAYED_RELEASE_TABLET | Freq: Every day | ORAL | 3 refills | Status: DC
  Filled 2023-09-15: qty 90, 90d supply, fill #0
  Filled 2024-03-30: qty 90, 90d supply, fill #1
  Filled 2024-06-29: qty 90, 90d supply, fill #2

## 2023-09-15 MED ORDER — VIBERZI 75 MG PO TABS
75.0000 mg | ORAL_TABLET | Freq: Two times a day (BID) | ORAL | 3 refills | Status: AC
Start: 2023-09-15 — End: ?
  Filled 2023-09-15: qty 180, 90d supply, fill #0

## 2023-09-21 ENCOUNTER — Other Ambulatory Visit (HOSPITAL_COMMUNITY): Payer: Self-pay

## 2023-09-22 ENCOUNTER — Other Ambulatory Visit (HOSPITAL_COMMUNITY): Payer: Self-pay

## 2023-09-22 MED ORDER — LEVOCETIRIZINE DIHYDROCHLORIDE 5 MG PO TABS
5.0000 mg | ORAL_TABLET | Freq: Every day | ORAL | 3 refills | Status: DC
  Filled 2023-09-22: qty 30, 30d supply, fill #0
  Filled 2023-10-07: qty 30, 30d supply, fill #1
  Filled 2023-11-21: qty 30, 30d supply, fill #2
  Filled 2023-12-26: qty 30, 30d supply, fill #3

## 2023-09-23 ENCOUNTER — Other Ambulatory Visit (HOSPITAL_COMMUNITY): Payer: Self-pay

## 2023-09-24 ENCOUNTER — Other Ambulatory Visit: Payer: Self-pay

## 2023-09-29 ENCOUNTER — Other Ambulatory Visit (HOSPITAL_COMMUNITY): Payer: Self-pay

## 2023-09-29 MED ORDER — MONTELUKAST SODIUM 10 MG PO TABS
10.0000 mg | ORAL_TABLET | Freq: Every day | ORAL | 3 refills | Status: AC
  Filled 2023-09-29 – 2023-12-26 (×2): qty 90, 90d supply, fill #0
  Filled 2024-02-23 – 2024-03-30 (×2): qty 90, 90d supply, fill #1

## 2023-09-29 MED ORDER — AMPHETAMINE-DEXTROAMPHETAMINE 5 MG PO TABS
5.0000 mg | ORAL_TABLET | Freq: Two times a day (BID) | ORAL | 0 refills | Status: AC
  Filled 2023-09-29: qty 60, 30d supply, fill #0

## 2023-09-29 MED ORDER — OLMESARTAN MEDOXOMIL-HCTZ 40-12.5 MG PO TABS
1.0000 | ORAL_TABLET | Freq: Every day | ORAL | 3 refills | Status: AC
  Filled 2023-09-29 – 2023-11-21 (×2): qty 90, 90d supply, fill #0
  Filled 2024-02-23: qty 90, 90d supply, fill #1
  Filled 2024-05-21: qty 90, 90d supply, fill #2
  Filled 2024-08-30: qty 90, 90d supply, fill #3

## 2023-09-29 MED ORDER — ALLOPURINOL 300 MG PO TABS
300.0000 mg | ORAL_TABLET | Freq: Every day | ORAL | 3 refills | Status: AC
  Filled 2023-09-29 – 2023-11-21 (×2): qty 90, 90d supply, fill #0
  Filled 2024-02-23: qty 90, 90d supply, fill #1
  Filled 2024-05-21: qty 90, 90d supply, fill #2
  Filled 2024-08-30: qty 90, 90d supply, fill #3

## 2023-09-30 ENCOUNTER — Other Ambulatory Visit (HOSPITAL_COMMUNITY): Payer: Self-pay

## 2023-10-04 ENCOUNTER — Other Ambulatory Visit: Payer: Self-pay | Admitting: Internal Medicine

## 2023-10-04 DIAGNOSIS — E782 Mixed hyperlipidemia: Secondary | ICD-10-CM

## 2023-10-04 DIAGNOSIS — Z0000 Encounter for general adult medical examination without abnormal findings: Secondary | ICD-10-CM

## 2023-10-07 ENCOUNTER — Other Ambulatory Visit (HOSPITAL_COMMUNITY): Payer: Self-pay

## 2023-10-07 ENCOUNTER — Other Ambulatory Visit: Payer: Self-pay

## 2023-10-08 ENCOUNTER — Ambulatory Visit
Admission: RE | Admit: 2023-10-08 | Discharge: 2023-10-08 | Disposition: A | Discharge status: Home / Self Care | Payer: Self-pay | Source: Ambulatory Visit | Attending: Internal Medicine | Admitting: Internal Medicine

## 2023-10-08 DIAGNOSIS — E782 Mixed hyperlipidemia: Secondary | ICD-10-CM | POA: Insufficient documentation

## 2023-10-08 DIAGNOSIS — Z0000 Encounter for general adult medical examination without abnormal findings: Secondary | ICD-10-CM | POA: Insufficient documentation

## 2023-11-22 ENCOUNTER — Other Ambulatory Visit (HOSPITAL_COMMUNITY): Payer: Self-pay

## 2023-12-26 ENCOUNTER — Other Ambulatory Visit (HOSPITAL_COMMUNITY): Payer: Self-pay

## 2023-12-27 ENCOUNTER — Other Ambulatory Visit (HOSPITAL_COMMUNITY): Payer: Self-pay

## 2023-12-27 ENCOUNTER — Other Ambulatory Visit: Payer: Self-pay

## 2024-02-23 ENCOUNTER — Other Ambulatory Visit (HOSPITAL_COMMUNITY): Payer: Self-pay

## 2024-02-23 ENCOUNTER — Other Ambulatory Visit: Payer: Self-pay

## 2024-03-03 ENCOUNTER — Other Ambulatory Visit (HOSPITAL_COMMUNITY): Payer: Self-pay

## 2024-03-30 ENCOUNTER — Other Ambulatory Visit (HOSPITAL_COMMUNITY): Payer: Self-pay

## 2024-03-31 ENCOUNTER — Other Ambulatory Visit (HOSPITAL_COMMUNITY): Payer: Self-pay

## 2024-03-31 ENCOUNTER — Other Ambulatory Visit: Payer: Self-pay

## 2024-03-31 MED ORDER — LEVOCETIRIZINE DIHYDROCHLORIDE 5 MG PO TABS
5.0000 mg | ORAL_TABLET | Freq: Every day | ORAL | 0 refills | Status: AC
  Filled 2024-03-31: qty 30, 30d supply, fill #0

## 2024-05-22 ENCOUNTER — Other Ambulatory Visit (HOSPITAL_COMMUNITY): Payer: Self-pay

## 2024-05-22 ENCOUNTER — Other Ambulatory Visit: Payer: Self-pay

## 2024-05-23 ENCOUNTER — Other Ambulatory Visit: Payer: Self-pay

## 2024-05-26 ENCOUNTER — Other Ambulatory Visit (HOSPITAL_COMMUNITY): Payer: Self-pay

## 2024-05-26 ENCOUNTER — Other Ambulatory Visit: Payer: Self-pay

## 2024-05-26 MED ORDER — DOXYCYCLINE HYCLATE 100 MG PO TABS
100.0000 mg | ORAL_TABLET | Freq: Two times a day (BID) | ORAL | 0 refills | Status: AC
  Filled 2024-05-26: qty 20, 10d supply, fill #0

## 2024-06-29 ENCOUNTER — Other Ambulatory Visit (HOSPITAL_COMMUNITY): Payer: Self-pay

## 2024-06-30 ENCOUNTER — Other Ambulatory Visit: Payer: Self-pay

## 2024-06-30 ENCOUNTER — Other Ambulatory Visit (HOSPITAL_COMMUNITY): Payer: Self-pay

## 2024-06-30 MED ORDER — AZELASTINE HCL 0.05 % OP SOLN
1.0000 [drp] | Freq: Two times a day (BID) | OPHTHALMIC | 1 refills | Status: AC
  Filled 2024-06-30: qty 6, 30d supply, fill #0

## 2024-06-30 MED ORDER — MONTELUKAST SODIUM 10 MG PO TABS
10.0000 mg | ORAL_TABLET | Freq: Every day | ORAL | 3 refills | Status: AC
  Filled 2024-06-30: qty 90, 90d supply, fill #0
  Filled 2024-08-30: qty 90, 90d supply, fill #1

## 2024-08-07 ENCOUNTER — Other Ambulatory Visit (HOSPITAL_COMMUNITY): Payer: Self-pay

## 2024-08-28 ENCOUNTER — Other Ambulatory Visit (HOSPITAL_COMMUNITY): Payer: Self-pay

## 2024-08-28 MED ORDER — FLUTICASONE FUROATE-VILANTEROL 100-25 MCG/ACT IN AEPB
1.0000 | INHALATION_SPRAY | Freq: Every day | RESPIRATORY_TRACT | 5 refills | Status: AC
  Filled 2024-08-28: qty 60, 30d supply, fill #0

## 2024-08-28 MED ORDER — AZELASTINE HCL 0.05 % OP SOLN
1.0000 [drp] | Freq: Two times a day (BID) | OPHTHALMIC | 5 refills | Status: AC
  Filled 2024-08-28: qty 6, 30d supply, fill #0

## 2024-08-28 MED ORDER — LEVALBUTEROL TARTRATE 45 MCG/ACT IN AERO
2.0000 | INHALATION_SPRAY | Freq: Four times a day (QID) | RESPIRATORY_TRACT | 0 refills | Status: AC | PRN
  Filled 2024-08-28: qty 15, 30d supply, fill #0

## 2024-08-28 MED ORDER — MONTELUKAST SODIUM 10 MG PO TABS
10.0000 mg | ORAL_TABLET | Freq: Every day | ORAL | 5 refills | Status: AC
  Filled 2024-08-28 – 2024-08-30 (×2): qty 30, 30d supply, fill #0

## 2024-08-29 ENCOUNTER — Other Ambulatory Visit: Payer: Self-pay

## 2024-08-29 ENCOUNTER — Other Ambulatory Visit (HOSPITAL_COMMUNITY): Payer: Self-pay

## 2024-08-29 MED ORDER — ALBUTEROL SULFATE HFA 108 (90 BASE) MCG/ACT IN AERS
2.0000 | INHALATION_SPRAY | RESPIRATORY_TRACT | 0 refills | Status: AC | PRN
  Filled 2024-08-29: qty 6.7, 17d supply, fill #0

## 2024-08-31 ENCOUNTER — Other Ambulatory Visit (HOSPITAL_COMMUNITY): Payer: Self-pay

## 2024-08-31 ENCOUNTER — Other Ambulatory Visit: Payer: Self-pay

## 2024-09-15 ENCOUNTER — Other Ambulatory Visit: Payer: Self-pay

## 2024-09-15 ENCOUNTER — Other Ambulatory Visit (HOSPITAL_COMMUNITY): Payer: Self-pay

## 2024-09-15 MED ORDER — PANTOPRAZOLE SODIUM 40 MG PO TBEC
40.0000 mg | DELAYED_RELEASE_TABLET | Freq: Every day | ORAL | 3 refills | Status: AC
  Filled 2024-09-15: qty 90, 90d supply, fill #0

## 2024-09-15 MED ORDER — VIBERZI 75 MG PO TABS
75.0000 mg | ORAL_TABLET | Freq: Two times a day (BID) | ORAL | 3 refills | Status: AC
  Filled 2024-09-15: qty 180, 90d supply, fill #0

## 2024-09-18 ENCOUNTER — Other Ambulatory Visit: Payer: Self-pay
